# Patient Record
Sex: Female | Born: 2000 | Race: Black or African American | Hispanic: No | Marital: Single | Smoking: Never smoker
Health system: Southern US, Community
[De-identification: ages and names within clinical notes are randomized; demographics above are authoritative.]

## PROBLEM LIST (undated history)

## (undated) DIAGNOSIS — N83209 Unspecified ovarian cyst, unspecified side: Secondary | ICD-10-CM

## (undated) DIAGNOSIS — E0590 Thyrotoxicosis, unspecified without thyrotoxic crisis or storm: Secondary | ICD-10-CM

## (undated) HISTORY — DX: Thyrotoxicosis, unspecified without thyrotoxic crisis or storm: E05.90

## (undated) HISTORY — DX: Unspecified ovarian cyst, unspecified side: N83.209

## (undated) HISTORY — PX: NO PAST SURGERIES: SHX2092

---
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
## 2021-01-17 ENCOUNTER — Other Ambulatory Visit: Payer: Self-pay | Admitting: Family Medicine

## 2021-01-17 DIAGNOSIS — R109 Unspecified abdominal pain: Secondary | ICD-10-CM

## 2021-02-03 ENCOUNTER — Ambulatory Visit
Admission: RE | Admit: 2021-02-03 | Discharge: 2021-02-03 | Disposition: A | Discharge status: Home / Self Care | Payer: Medicaid Other | Source: Ambulatory Visit | Attending: Family Medicine | Admitting: Family Medicine

## 2021-02-03 DIAGNOSIS — R109 Unspecified abdominal pain: Secondary | ICD-10-CM

## 2021-07-12 ENCOUNTER — Encounter (HOSPITAL_COMMUNITY): Payer: Self-pay | Admitting: Emergency Medicine

## 2021-07-12 ENCOUNTER — Emergency Department (HOSPITAL_COMMUNITY): Payer: Medicaid Other

## 2021-07-12 ENCOUNTER — Emergency Department (HOSPITAL_COMMUNITY)
Admission: EM | Admit: 2021-07-12 | Discharge: 2021-07-12 | Disposition: A | Discharge status: Home / Self Care | Payer: Medicaid Other | Attending: Emergency Medicine | Admitting: Emergency Medicine

## 2021-07-12 DIAGNOSIS — J101 Influenza due to other identified influenza virus with other respiratory manifestations: Secondary | ICD-10-CM | POA: Insufficient documentation

## 2021-07-12 DIAGNOSIS — E876 Hypokalemia: Secondary | ICD-10-CM | POA: Diagnosis not present

## 2021-07-12 DIAGNOSIS — Z20822 Contact with and (suspected) exposure to covid-19: Secondary | ICD-10-CM | POA: Insufficient documentation

## 2021-07-12 DIAGNOSIS — R9389 Abnormal findings on diagnostic imaging of other specified body structures: Secondary | ICD-10-CM

## 2021-07-12 DIAGNOSIS — Z7722 Contact with and (suspected) exposure to environmental tobacco smoke (acute) (chronic): Secondary | ICD-10-CM | POA: Diagnosis not present

## 2021-07-12 DIAGNOSIS — N9489 Other specified conditions associated with female genital organs and menstrual cycle: Secondary | ICD-10-CM | POA: Insufficient documentation

## 2021-07-12 DIAGNOSIS — R000 Tachycardia, unspecified: Secondary | ICD-10-CM | POA: Insufficient documentation

## 2021-07-12 DIAGNOSIS — R918 Other nonspecific abnormal finding of lung field: Secondary | ICD-10-CM | POA: Insufficient documentation

## 2021-07-12 DIAGNOSIS — R509 Fever, unspecified: Secondary | ICD-10-CM | POA: Diagnosis present

## 2021-07-12 LAB — CBC WITH DIFFERENTIAL/PLATELET
Abs Immature Granulocytes: 0.01 10*3/uL (ref 0.00–0.07)
Basophils Absolute: 0 10*3/uL (ref 0.0–0.1)
Basophils Relative: 0 %
Eosinophils Absolute: 0.1 10*3/uL (ref 0.0–0.5)
Eosinophils Relative: 1 %
HCT: 38.3 % (ref 36.0–46.0)
Hemoglobin: 12.6 g/dL (ref 12.0–15.0)
Immature Granulocytes: 0 %
Lymphocytes Relative: 50 %
Lymphs Abs: 2.8 10*3/uL (ref 0.7–4.0)
MCH: 26 pg (ref 26.0–34.0)
MCHC: 32.9 g/dL (ref 30.0–36.0)
MCV: 79.1 fL — ABNORMAL LOW (ref 80.0–100.0)
Monocytes Absolute: 0.8 10*3/uL (ref 0.1–1.0)
Monocytes Relative: 14 %
Neutro Abs: 2 10*3/uL (ref 1.7–7.7)
Neutrophils Relative %: 35 %
Platelets: 254 10*3/uL (ref 150–400)
RBC: 4.84 MIL/uL (ref 3.87–5.11)
RDW: 12.1 % (ref 11.5–15.5)
WBC: 5.7 10*3/uL (ref 4.0–10.5)
nRBC: 0 % (ref 0.0–0.2)

## 2021-07-12 LAB — URINALYSIS, ROUTINE W REFLEX MICROSCOPIC
Bilirubin Urine: NEGATIVE
Glucose, UA: NEGATIVE mg/dL
Hgb urine dipstick: NEGATIVE
Ketones, ur: NEGATIVE mg/dL
Nitrite: NEGATIVE
Protein, ur: NEGATIVE mg/dL
Specific Gravity, Urine: 1.005 (ref 1.005–1.030)
pH: 6 (ref 5.0–8.0)

## 2021-07-12 LAB — BASIC METABOLIC PANEL
Anion gap: 9 (ref 5–15)
BUN: 7 mg/dL (ref 6–20)
CO2: 23 mmol/L (ref 22–32)
Calcium: 8.5 mg/dL — ABNORMAL LOW (ref 8.9–10.3)
Chloride: 103 mmol/L (ref 98–111)
Creatinine, Ser: 0.42 mg/dL — ABNORMAL LOW (ref 0.44–1.00)
GFR, Estimated: >60 mL/min (ref >60)
Glucose, Bld: 102 mg/dL — ABNORMAL HIGH (ref 70–99)
Potassium: 3.2 mmol/L — ABNORMAL LOW (ref 3.5–5.1)
Sodium: 135 mmol/L (ref 135–145)

## 2021-07-12 LAB — RESP PANEL BY RT-PCR (FLU A&B, COVID) ARPGX2
Influenza A by PCR: POSITIVE — AB
Influenza B by PCR: NEGATIVE
SARS Coronavirus 2 by RT PCR: NEGATIVE

## 2021-07-12 MED ORDER — ACETAMINOPHEN 500 MG PO TABS
1000.0000 mg | ORAL_TABLET | Freq: Once | ORAL | Status: AC
  Administered 2021-07-12: 1000 mg via ORAL
  Filled 2021-07-12: qty 2

## 2021-07-12 MED ORDER — BENZONATATE 100 MG PO CAPS: 100.0000 mg | ORAL_CAPSULE | Freq: Three times a day (TID) | ORAL | 0 refills | Status: DC

## 2021-07-12 MED ORDER — SODIUM CHLORIDE 0.9 % IV BOLUS
1000.0000 mL | Freq: Once | INTRAVENOUS | Status: AC
  Administered 2021-07-12: 1000 mL via INTRAVENOUS

## 2021-07-12 NOTE — Discharge Instructions (Addendum)
Please read and follow all provided instructions.  Your diagnoses today include:  1. Influenza A   2. Abnormal chest x-ray   3. Sinus tachycardia     Tests performed today include: Complete blood cell count: was normal Complete metabolic panel: slightly low potassium Urinalysis (urine test): some infection cells but not a clean urine sample Pregnancy test (urine or blood, in women only): negative Flu test: POSITIVE COVID test: NEGATIVE Chest x-ray: No pneumonia, but was abnormal and suggested possible swollen lymph nodes in the chest.  You should have this rechecked by your doctor in the next couple of weeks.  You may need a repeat x-ray or potentially a CT scan. Vital signs. See below for your results today.   Medications prescribed:  Tessalon Perles - cough suppressant medication  Take any prescribed medications only as directed.  Home care instructions:  Follow any educational materials contained in this packet. Please continue drinking plenty of fluids. Use over-the-counter cold and flu medications as needed as directed on packaging for symptom relief. You may also use ibuprofen or tylenol as directed on packaging for pain or fever.   BE VERY CAREFUL not to take multiple medicines containing Tylenol (also called acetaminophen). Doing so can lead to an overdose which can damage your liver and cause liver failure and possibly death.   Return instructions:  Please return to the Emergency Department if you experience worsening symptoms. Please return if you have a high fever greater than 101 degrees not controlled with over-the-counter medications, persistent vomiting and cannot keep down fluids, or worsening trouble breathing. Please return if you have any other emergent concerns.  Additional Information:  Your vital signs today were: BP (!) 156/98 (BP Location: Left Arm)   Pulse (!) 114   Temp 100.1 F (37.8 C)   Resp 20   Ht 5\' 4"  (1.626 m)   Wt 70.3 kg   LMP 06/11/2021    SpO2 100%   BMI 26.61 kg/m  If your blood pressure (BP) was elevated above 135/85 this visit, please have this repeated by your doctor within one month.

## 2021-07-12 NOTE — ED Triage Notes (Signed)
Per pt, states she is having flu like symptoms since Saturday-congestion, cough, fever, body aches

## 2021-07-12 NOTE — ED Notes (Signed)
Pt states understanding of dc instructions, follow up and OTC medications. Pt denies questions or concerns upon dc. Pt ambulated out of ed w/ steady gait.

## 2021-07-12 NOTE — ED Provider Notes (Signed)
[facility] DEPT Provider Note   CSN: [id number] Arrival date & time: 07/12/21  1012     History Chief Complaint  Patient presents with   flu like symptoms    Lisa Calderon is a 20 y.o. female.  Patient with no significant past medical history presents to the emergency department for evaluation of flulike illness.  Today is day 5 of symptoms.  She developed subjective fever, chills, body aches, cough, sore throat and congestion.  She denies associated nausea and vomiting.  She has had a couple episodes of diarrhea.  No urinary symptoms.  No improvement with over-the-counter medications.  She reports decreased oral fluid and solid intake.  Does have chest pain with coughing and some shortness of breath.      History reviewed. No pertinent past medical history.  There are no problems to display for this patient.   History reviewed. No pertinent surgical history.   OB History   No obstetric history on file.     No family history on file.  Social History   Tobacco Use   Smoking status: Passive Smoke Exposure - Never Smoker    Home Medications Prior to Admission medications   Medication Sig Start Date End Date Taking? Authorizing Provider  benzonatate (TESSALON) 100 MG capsule Take 1 capsule (100 mg total) by mouth every 8 (eight) hours. 10/14/15   Charmayne Sheer, NP  ibuprofen (ADVIL,MOTRIN) 200 MG tablet Take 400 mg by mouth every 6 (six) hours as needed.    [provider]  permethrin (ELIMITE) 5 % cream Apply to affected area once and wash off after 8-10 days.  Please repeat in 7-10 days qs 01/02/14   Isaac Bliss, MD    Allergies    Patient has no known allergies.  Review of Systems   Review of Systems  Constitutional:  Positive for appetite change, chills, fatigue and fever.  HENT:  Positive for congestion, rhinorrhea and sore throat.   Eyes:  Negative for redness.  Respiratory:  Positive for cough and shortness of  breath.   Cardiovascular:  Positive for chest pain.  Gastrointestinal:  Positive for diarrhea. Negative for abdominal pain, nausea and vomiting.  Genitourinary:  Negative for dysuria, frequency, hematuria and urgency.  Musculoskeletal:  Positive for myalgias.  Skin:  Negative for rash.  Neurological:  Negative for headaches.   Physical Exam Updated Vital Signs BP (!) 140/100 (BP Location: Left Arm)   Pulse (!) 141   Temp 99 F (37.2 C) (Oral)   Resp 20   Ht 5\' 4"  (1.626 m)   Wt 70.3 kg   LMP 06/11/2021   SpO2 100%   BMI 26.61 kg/m   Physical Exam Vitals and nursing note reviewed.  Constitutional:      General: She is not in acute distress.    Appearance: She is well-developed.  HENT:     Head: Normocephalic and atraumatic.     Right Ear: Tympanic membrane, ear canal and external ear normal.     Left Ear: Tympanic membrane, ear canal and external ear normal.     Nose: Congestion present.     Mouth/Throat:     Pharynx: Posterior oropharyngeal erythema present.  Eyes:     Conjunctiva/sclera: Conjunctivae normal.  Cardiovascular:     Rate and Rhythm: Regular rhythm. Tachycardia present.     Heart sounds: No murmur heard. Pulmonary:     Effort: No respiratory distress.     Breath sounds: No wheezing, rhonchi or rales.  Abdominal:  Palpations: Abdomen is soft.     Tenderness: There is no abdominal tenderness. There is no guarding or rebound.  Musculoskeletal:     Cervical back: Normal range of motion and neck supple.     Right lower leg: No edema.     Left lower leg: No edema.  Skin:    General: Skin is warm and dry.     Findings: No rash.  Neurological:     General: No focal deficit present.     Mental Status: She is alert. Mental status is at baseline.     Motor: No weakness.  Psychiatric:        Mood and Affect: Mood normal.    ED Results / Procedures / Treatments   Labs (all labs ordered are listed, but only abnormal results are displayed) Labs Reviewed   RESP PANEL BY RT-PCR (FLU A&B, COVID) ARPGX2 - Abnormal; Notable for the following components:      Result Value   Influenza A by PCR POSITIVE (*)    All other components within normal limits  CBC WITH DIFFERENTIAL/PLATELET - Abnormal; Notable for the following components:   MCV 79.1 (*)    All other components within normal limits  BASIC METABOLIC PANEL - Abnormal; Notable for the following components:   Potassium 3.2 (*)    Glucose, Bld 102 (*)    Creatinine, Ser 0.42 (*)    Calcium 8.5 (*)    All other components within normal limits  URINALYSIS, ROUTINE W REFLEX MICROSCOPIC - Abnormal; Notable for the following components:   APPearance CLOUDY (*)    Leukocytes,Ua LARGE (*)    Bacteria, UA RARE (*)    All other components within normal limits  I-STAT BETA HCG BLOOD, ED ([facility], [facility], [facility] ONLY)    EKG None  Radiology No results found.  Procedures Procedures   Medications Ordered in ED Medications  sodium chloride 0.9 % bolus 1,000 mL (has no administration in time range)    ED Course  I have reviewed the triage vital signs and the nursing notes.  Pertinent labs & imaging results that were available during my care of the patient were reviewed by me and considered in my medical decision making (see chart for details).  Patient seen and examined. Work-up initiated. Medications ordered.  Patient history and exam suggestive of flulike illness however her heart rate is between 125 and 140 in the room.  She does appear to be a bit dry on exam.  She would benefit from IV hydration and work-up including labs and chest x-ray.  Will reassess.  She is not hypoxic and does not have any other risk factors suggestive of pulmonary embolism.  Vital signs reviewed and are as follows: BP (!) 140/100 (BP Location: Left Arm)   Pulse (!) 141   Temp 99 F (37.2 C) (Oral)   Resp 20   Ht 5\' 4"  (1.626 m)   Wt 70.3 kg   LMP 06/11/2021   SpO2 100%   BMI 26.61 kg/m   1:19 PM patient is feeling  a bit better.  She still has some congestion in her chest.  She has received IV fluids which have made her feel better.  Heart rate improved into the low 110's.   We discussed abnormal x-ray findings and need to follow-up with PCP.  I do not think that she needs further work-up today given that her symptoms are consistent with her positive test for influenza.  Normal CBC.  We will continue supportive  treatment.  Patient discharged to home. Encouraged to rest and drink plenty of fluids.  Patient told to return to ED or see their primary doctor if their symptoms worsen, high fever not controlled with tylenol, persistent vomiting, they feel they are dehydrated, or if they have any other concerns.  Patient verbalized understanding and agreed with plan.       MDM Rules/Calculators/A&P                           Flulike illness: Negative COVID.  Positive influenza test.  Labs are reassuring.  Chest x-ray without pneumonia.  Sinus tachycardia: Was 130s to 140s on arrival, now 110s.  This was after IV fluids.  No concerning features of DVT/PE.  Patient has not been hypoxic.  She is not tachypneic.  Do not think that she requires further work-up with D-dimer or CT of the chest today.  Abnormal chest x-ray: Shows possibility of lymphadenopathy.  No pneumonia.  Patient will have this followed up as an outpatient.  Discussed with patient at bedside.  Low potassium: Minimally low at 3.2.  Do not think that this has anything to do with her tachycardia.  Patient is tolerating solids and can continue p.o. intake.  Abnormal UA: Not a clean-catch, no urinary symptoms.  Do not suspect UTI or pyelonephritis.    Final Clinical Impression(s) / ED Diagnoses Final diagnoses:  Influenza A  Abnormal chest x-ray  Sinus tachycardia    Rx / DC Orders ED Discharge Orders     None        Carlisle Cater, PA-C 07/12/21 1323    Daleen Bo, MD 07/12/21 1418

## 2021-07-12 NOTE — ED Notes (Signed)
Pt tolerating fluids well. Pt denies nausea.  Pt given water and OJ

## 2021-08-02 LAB — OB RESULTS CONSOLE GC/CHLAMYDIA
Chlamydia: NEGATIVE
Neisseria Gonorrhea: NEGATIVE

## 2021-08-09 LAB — OB RESULTS CONSOLE HIV ANTIBODY (ROUTINE TESTING): HIV: NONREACTIVE

## 2021-08-09 LAB — OB RESULTS CONSOLE ANTIBODY SCREEN: Antibody Screen: NEGATIVE

## 2021-08-09 LAB — HEPATITIS C ANTIBODY: HCV Ab: NEGATIVE

## 2021-08-09 LAB — OB RESULTS CONSOLE ABO/RH: RH Type: POSITIVE

## 2021-08-09 LAB — OB RESULTS CONSOLE RPR: RPR: NONREACTIVE

## 2021-08-09 LAB — OB RESULTS CONSOLE RUBELLA ANTIBODY, IGM: Rubella: IMMUNE

## 2021-08-09 LAB — OB RESULTS CONSOLE HEPATITIS B SURFACE ANTIGEN: Hepatitis B Surface Ag: NEGATIVE

## 2021-08-10 NOTE — Progress Notes (Deleted)
Name: Lisa Calderon  MRN/ DOB: [id number], 07-02-2001    Age/ Sex: 20 y.o., female    PCP: Deatra James, MD   Reason for Endocrinology Evaluation: Hyperthyroidism     Date of Initial Endocrinology Evaluation: 08/11/2021     HPI: [name] is a 20 y.o. female with unremarkable PMH. The patient presented for initial endocrinology clinic visit on 08/11/2021 for consultative assistance with her Hyperthyroidism .     She was diagnosed with hyperthyroidism in 07/2021 during routine physical when she was noted to have a suppressed TSH 0.01 uIU/mL and elevated FT4 4.42 ng/dL.     LMP 07/02/2021  HISTORY:  Past Medical History: No past medical history on file. Past Surgical History: No past surgical history on file.  Social History:  reports that she is a non-smoker but has been exposed to tobacco smoke. She does not have any smokeless tobacco history on file. Family History: family history is not on file.   HOME MEDICATIONS: Allergies as of 08/11/2021   No Known Allergies      Medication List        Accurate as of August 11, 2021 11:12 AM. If you have any questions, ask your nurse or doctor.          benzonatate 100 MG capsule Commonly known as: TESSALON Take 1 capsule (100 mg total) by mouth every 8 (eight) hours.   ibuprofen 200 MG tablet Commonly known as: ADVIL Take 400 mg by mouth every 6 (six) hours as needed for fever, headache or mild pain.   Vicks DayQuil/NyQuil Cough 6.25-15 & 15 MG/15ML Lqpk Generic drug: Doxylamine-DM & DM Take 15 mLs by mouth 2 (two) times daily as needed (congestion/sleep).          REVIEW OF SYSTEMS: A comprehensive ROS was conducted with the patient and is negative except as per HPI and below:  ROS     OBJECTIVE:  VS: There were no vitals taken for this visit.   Wt Readings from Last 3 Encounters:  07/12/21 155 lb (70.3 kg)  10/14/15 177 lb 12.8 oz (80.6 kg) (96 %, Z= 1.75)*   * Growth percentiles are based  on CDC (Girls, 2-20 Years) data.     EXAM: General: Pt appears well and is in NAD  Hydration: Well-hydrated with moist mucous membranes and good skin turgor  Eyes: External eye exam normal without stare, lid lag or exophthalmos.  EOM intact.  PERRL.  Ears, Nose, Throat: Hearing: Grossly intact bilaterally Dental: Good dentition  Throat: Clear without mass, erythema or exudate  Neck: General: Supple without adenopathy. Thyroid: Thyroid size normal.  No goiter or nodules appreciated. No thyroid bruit.  Lungs: Clear with good BS bilat with no rales, rhonchi, or wheezes  Heart: Auscultation: RRR.  Abdomen: Normoactive bowel sounds, soft, nontender, without masses or organomegaly palpable  Extremities: Gait and station: Normal gait  Digits and nails: No clubbing, cyanosis, petechiae, or nodes Head and neck: Normal alignment and mobility BL UE: Normal ROM and strength. BL LE: No pretibial edema normal ROM and strength.  Skin: Hair: Texture and amount normal with gender appropriate distribution Skin Inspection: No rashes, acanthosis nigricans/skin tags. No lipohypertrophy Skin Palpation: Skin temperature, texture, and thickness normal to palpation  Neuro: Cranial nerves: II - XII grossly intact  Cerebellar: Normal coordination and movement; no tremor Motor: Normal strength throughout DTRs: 2+ and symmetric in UE without delay in relaxation phase  Mental Status: Judgment, insight: Intact Orientation: Oriented to time,  place, and person Memory: Intact for recent and remote events Mood and affect: No depression, anxiety, or agitation     DATA REVIEWED: ***    ASSESSMENT/PLAN/RECOMMENDATIONS:   ***    Medications :  Signed electronically by: Lyndle Herrlich, MD  [facility] Endocrinology  [facility] [address] [place], [place] [address] Phone: [phone] FAX: [phone]   CC: Deatra James, MD [address] [place]  [place] [address] Phone: [phone] Fax: [phone]   Return to Endocrinology clinic as below: Future Appointments  Date Time Provider Department Center  08/11/2021 11:30 AM Stefanee Mckell, Konrad Dolores, MD [facility] None

## 2021-08-11 ENCOUNTER — Ambulatory Visit: Payer: Medicaid Other | Admitting: Internal Medicine

## 2021-08-17 ENCOUNTER — Ambulatory Visit: Payer: Medicaid Other | Admitting: Internal Medicine

## 2021-08-17 ENCOUNTER — Encounter: Payer: Self-pay | Admitting: Internal Medicine

## 2021-08-17 ENCOUNTER — Other Ambulatory Visit: Payer: Self-pay

## 2021-08-17 VITALS — BP 144/80 | HR 102 | Ht 64.0 in | Wt 154.8 lb

## 2021-08-17 DIAGNOSIS — E0590 Thyrotoxicosis, unspecified without thyrotoxic crisis or storm: Secondary | ICD-10-CM | POA: Diagnosis not present

## 2021-08-17 DIAGNOSIS — O99280 Endocrine, nutritional and metabolic diseases complicating pregnancy, unspecified trimester: Secondary | ICD-10-CM

## 2021-08-17 LAB — COMPREHENSIVE METABOLIC PANEL
ALT: 20 U/L (ref 0–35)
AST: 15 U/L (ref 0–37)
Albumin: 4 g/dL (ref 3.5–5.2)
Alkaline Phosphatase: 116 U/L (ref 39–117)
BUN: 7 mg/dL (ref 6–23)
CO2: 24 mEq/L (ref 19–32)
Calcium: 9.3 mg/dL (ref 8.4–10.5)
Chloride: 101 mEq/L (ref 96–112)
Creatinine, Ser: 0.31 mg/dL — ABNORMAL LOW (ref 0.40–1.20)
GFR: 150.03 mL/min (ref >60.00)
Glucose, Bld: 96 mg/dL (ref 70–99)
Potassium: 4.2 mEq/L (ref 3.5–5.1)
Sodium: 132 mEq/L — ABNORMAL LOW (ref 135–145)
Total Bilirubin: 0.3 mg/dL (ref 0.2–1.2)
Total Protein: 7 g/dL (ref 6.0–8.3)

## 2021-08-17 LAB — CBC WITH DIFFERENTIAL/PLATELET
Basophils Absolute: 0 10*3/uL (ref 0.0–0.1)
Basophils Relative: 0.3 % (ref 0.0–3.0)
Eosinophils Absolute: 0 10*3/uL (ref 0.0–0.7)
Eosinophils Relative: 0.6 % (ref 0.0–5.0)
HCT: 35.7 % — ABNORMAL LOW (ref 36.0–46.0)
Hemoglobin: 11.8 g/dL — ABNORMAL LOW (ref 12.0–15.0)
Lymphocytes Relative: 43 % (ref 12.0–46.0)
Lymphs Abs: 3.2 10*3/uL (ref 0.7–4.0)
MCHC: 33.2 g/dL (ref 30.0–36.0)
MCV: 79.5 fl (ref 78.0–100.0)
Monocytes Absolute: 0.8 10*3/uL (ref 0.1–1.0)
Monocytes Relative: 10.9 % (ref 3.0–12.0)
Neutro Abs: 3.3 10*3/uL (ref 1.4–7.7)
Neutrophils Relative %: 45.2 % (ref 43.0–77.0)
Platelets: 374 10*3/uL (ref 150.0–400.0)
RBC: 4.49 Mil/uL (ref 3.87–5.11)
RDW: 13.4 % (ref 11.5–15.5)
WBC: 7.4 10*3/uL (ref 4.0–10.5)

## 2021-08-17 LAB — TSH: TSH: <0.01 u[IU]/mL — ABNORMAL LOW (ref 0.35–5.50)

## 2021-08-17 LAB — T3, FREE: T3, Free: 13.5 pg/mL — ABNORMAL HIGH (ref 2.3–4.2)

## 2021-08-17 LAB — T4, FREE: Free T4: 3.45 ng/dL — ABNORMAL HIGH (ref 0.60–1.60)

## 2021-08-17 NOTE — Progress Notes (Signed)
Name: Lisa Calderon  MRN/ DOB: [id number], 2001/04/19    Age/ Sex: 20 y.o., female    PCP: Deatra James, MD   Reason for Endocrinology Evaluation: Hyperthyroidism     Date of Initial Endocrinology Evaluation: 08/17/2021     HPI: [name] is a 20 y.o. female with unremarkable PMH. The patient presented for initial endocrinology clinic visit on 08/17/2021 for consultative assistance with her Hyperthyroidism .     She was diagnosed with hyperthyroidism in 07/2021 during routine physical when she was noted to have a suppressed TSH 0.01 uIU/mL and elevated FT4 4.42 ng/dL. She also tested positive for influenza at the time as well.     LMP 07/02/2021   She had a positive pregnancy test and has a pending appointment with  [name] Scheduled 12/27th    Denies weight loss but has been bloated  Denies nausea or vomiting  Denies diarrhea  Denies tremors  Has rare random  palpitations  Denies local neck swelling or pain   Denies FH of thyroid disease      HISTORY:  Past Medical History: History reviewed. No pertinent past medical history. Past Surgical History: History reviewed. No pertinent surgical history.  Social History:  reports that she has never smoked. She has been exposed to tobacco smoke. She does not have any smokeless tobacco history on file. Family History: family history is not on file.   HOME MEDICATIONS: Allergies as of 08/17/2021   No Known Allergies      Medication List        Accurate as of August 17, 2021  1:48 PM. If you have any questions, ask your nurse or doctor.          STOP taking these medications    benzonatate 100 MG capsule Commonly known as: TESSALON Stopped by: Scarlette Shorts, MD   ibuprofen 200 MG tablet Commonly known as: ADVIL Stopped by: Scarlette Shorts, MD   Vicks DayQuil/NyQuil Cough 6.25-15 & 15 MG/15ML Lqpk Generic drug: Doxylamine-DM & DM Stopped by: Scarlette Shorts, MD       TAKE  these medications    Prenatal Vitamin 27-0.8 MG Tabs Take 1 tablet by mouth.          REVIEW OF SYSTEMS: A comprehensive ROS was conducted with the patient and is negative except as per HPI     OBJECTIVE:  VS: BP (!) 144/80 (BP Location: Left Arm, Patient Position: Sitting, Cuff Size: Small)    Pulse (!) 102    Ht 5\' 4"  (1.626 m)    Wt 154 lb 12.8 oz (70.2 kg)    SpO2 98%    BMI 26.57 kg/m    Wt Readings from Last 3 Encounters:  08/17/21 154 lb 12.8 oz (70.2 kg)  07/12/21 155 lb (70.3 kg)  10/14/15 177 lb 12.8 oz (80.6 kg) (96 %, Z= 1.75)*   * Growth percentiles are based on CDC (Girls, 2-20 Years) data.     EXAM: General: Pt appears well and is in NAD  Eyes: External eye exam normal without stare, lid lag or exophthalmos.  EOM intact.  PERRL.  Neck: General: Supple without adenopathy. Thyroid: Thyroid size enlaraged ~ 60 grams.+ thyroid bruit.  Lungs: Clear with good BS bilat with no rales, rhonchi, or wheezes  Heart: Auscultation: RRR.  Abdomen: Normoactive bowel sounds, soft, nontender, without masses or organomegaly palpable  Extremities:  BL LE: No pretibial edema normal ROM and strength.  Mental Status: Judgment, insight:  Intact Orientation: Oriented to time, place, and person Mood and affect: No depression, anxiety, or agitation     DATA REVIEWED:  Latest Reference Range & Units 08/17/21 13:59  Sodium 135 - 145 mEq/L 132 (L)  Potassium 3.5 - 5.1 mEq/L 4.2  Chloride 96 - 112 mEq/L 101  CO2 19 - 32 mEq/L 24  Glucose 70 - 99 mg/dL 96  BUN 6 - 23 mg/dL 7  Creatinine 7.03 - 5.00 mg/dL 9.38 (L)  Calcium 8.4 - 10.5 mg/dL 9.3  Alkaline Phosphatase 39 - 117 U/L 116  Albumin 3.5 - 5.2 g/dL 4.0  AST 0 - 37 U/L 15  ALT 0 - 35 U/L 20  Total Protein 6.0 - 8.3 g/dL 7.0  Total Bilirubin 0.2 - 1.2 mg/dL 0.3  GFR >18.29 mL/min 150.03  WBC 4.0 - 10.5 K/uL 7.4  RBC 3.87 - 5.11 Mil/uL 4.49  Hemoglobin 12.0 - 15.0 g/dL 93.7 (L)  HCT 16.9 - 67.8 % 35.7 (L)  MCV 78.0 -  100.0 fl 79.5  MCHC 30.0 - 36.0 g/dL 93.8  RDW 10.1 - 75.1 % 13.4  Platelets 150.0 - 400.0 K/uL 374.0      Latest Reference Range & Units 08/17/21 13:59  Neutrophils 43.0 - 77.0 % 45.2  Lymphocytes 12.0 - 46.0 % 43.0  Monocytes Relative 3.0 - 12.0 % 10.9  Eosinophil 0.0 - 5.0 % 0.6  Basophil 0.0 - 3.0 % 0.3  NEUT# 1.4 - 7.7 K/uL 3.3  Lymphocyte # 0.7 - 4.0 K/uL 3.2  Monocyte # 0.1 - 1.0 K/uL 0.8  Eosinophils Absolute 0.0 - 0.7 K/uL 0.0  Basophils Absolute 0.0 - 0.1 K/uL 0.0  Glucose 70 - 99 mg/dL 96  Preg, Serum  POSITIVE !  TSH 0.35 - 5.50 uIU/mL <0.01 Repeated and verified X2. (L)  Triiodothyronine,Free,Serum 2.3 - 4.2 pg/mL 13.5 (H)  T4,Free(Direct) 0.60 - 1.60 ng/dL 0.25 (H)    ASSESSMENT/PLAN/RECOMMENDATIONS:   Hyperthyroidism :  - Pt is clinically euthyroid  - We discussed that Graves' Disease is a result of an autoimmune condition involving the thyroid.  - She had a positive pregnancy test at home, pending appointment with [name] next week  - We discussed D/D includes Graves' Disease vs subacute thyroiditis given that she was diagnosed with the Influenza and hyperthyroid at the same time   - We discussed with pt the benefits of methimazole/ PTU  in the Tx of hyperthyroidism, as well as the possible side effects/complications of anti-thyroid drug Tx (specifically detailing the rare, but serious side effect of agranulocytosis). She was informed of need for regular thyroid function monitoring while on methimazole to ensure appropriate dosage without over-treatment. As well, we discussed the possible side effects of methimazole/PTU  including the chance of rash, the small chance of liver irritation/juandice and the <=1 in 300-400 chance of sudden onset agranulocytosis.  We discussed importance of going to ED promptly (and stopping methimazole) if shewere to develop significant fever with severe sore throat of other evidence of acute infection.     - We extensively discussed  the various treatment options for hyperthyroidism and Graves disease including ablation therapy with radioactive iodine versus antithyroid drug treatment versus surgical therapy.  We recommended to the patient that we felt, at this time, that thionamide therapy would be most optimal.  We discussed the various possible benefits versus side effects of the various therapies.   - I carefully explained to the patient that one of the consequences of I-131 ablation treatment would likely  be permanent hypothyroidism which would require long-term replacement therapy with LT4.  - We explained the importance of serial labs during pregnancy  - The goal of treatment is to maintain persistent but mild hyperthyroidism in the mother in an attempt to prevent fetal hypothyroidism since the fetal thyroid is more sensitive to the action of thionamide therapy. Overtreatment of maternal hyperthyroidism can cause fetal goiter and primary hypothyroidism.       Medications : Start PTU 50 mg BID    F/U in 8 weeks  Labs in 4 weeks    Signed electronically by: Lyndle Herrlich, MD  [facility] Endocrinology  [facility] [address] [place] [place], [place] [address] Phone: [phone] FAX: [phone]   CC: Deatra James, MD [address] [place] [address] Phone: [phone] Fax: [phone]   Return to Endocrinology clinic as below: No future appointments.

## 2021-08-18 ENCOUNTER — Telehealth: Payer: Self-pay | Admitting: Internal Medicine

## 2021-08-18 DIAGNOSIS — O99280 Endocrine, nutritional and metabolic diseases complicating pregnancy, unspecified trimester: Secondary | ICD-10-CM | POA: Insufficient documentation

## 2021-08-18 DIAGNOSIS — E0590 Thyrotoxicosis, unspecified without thyrotoxic crisis or storm: Secondary | ICD-10-CM | POA: Insufficient documentation

## 2021-08-18 MED ORDER — PROPYLTHIOURACIL 50 MG PO TABS: 50.0000 mg | ORAL_TABLET | Freq: Two times a day (BID) | ORAL | 1 refills | Status: DC

## 2021-08-18 NOTE — Telephone Encounter (Signed)
Please let the pt know that her labs continue to show overactive thyroid and pregnancy has been confirmed      Please start PTU 50 mg TWICE a day    Remind her of her labs in 4 weeks     Thanks

## 2021-08-18 NOTE — Telephone Encounter (Signed)
Patient has been notified and verbalized understanding 

## 2021-08-18 NOTE — Telephone Encounter (Signed)
Vm left a for patient to callback for result

## 2021-08-21 LAB — HCG, SERUM, QUALITATIVE: Preg, Serum: POSITIVE — AB

## 2021-08-21 LAB — TRAB (TSH RECEPTOR BINDING ANTIBODY): TRAB: 18.78 IU/L — ABNORMAL HIGH (ref <=2.00)

## 2021-09-15 ENCOUNTER — Other Ambulatory Visit: Payer: Medicaid Other

## 2021-10-03 ENCOUNTER — Other Ambulatory Visit: Payer: Self-pay | Admitting: Obstetrics and Gynecology

## 2021-10-03 ENCOUNTER — Other Ambulatory Visit (HOSPITAL_COMMUNITY)
Admission: RE | Admit: 2021-10-03 | Discharge: 2021-10-03 | Disposition: A | Discharge status: Home / Self Care | Payer: Medicaid Other | Source: Ambulatory Visit | Attending: Obstetrics and Gynecology | Admitting: Obstetrics and Gynecology

## 2021-10-03 DIAGNOSIS — Z3490 Encounter for supervision of normal pregnancy, unspecified, unspecified trimester: Secondary | ICD-10-CM | POA: Diagnosis not present

## 2021-10-04 LAB — CYTOLOGY - PAP: Diagnosis: NEGATIVE

## 2021-10-13 ENCOUNTER — Ambulatory Visit: Payer: Medicaid Other | Admitting: Internal Medicine

## 2021-10-13 NOTE — Progress Notes (Unsigned)
Name: Lisa Calderon  MRN/ DOB: [id number], 2001-04-23    Age/ Sex: 21 y.o., female    PCP: Deatra James, MD   Reason for Endocrinology Evaluation: Hyperthyroidism     Date of Initial Endocrinology Evaluation: 08/17/2021    HPI: [name] is a 21 y.o. female with unremarkable PMH. The patient presented for initial endocrinology clinic visit on 08/17/2021 for consultative assistance with her Hyperthyroidism .     She was diagnosed with hyperthyroidism in 07/2021 during routine physical when she was noted to have a suppressed TSH 0.01 uIU/mL and elevated FT4 4.42 ng/dL. She also tested positive for influenza at the time as well.     LMP 07/02/2021   She had a positive pregnancy test and saw  [name] ( Dr. Gerald Leitz ) 10/03/2021  Was started on PTU 07/2021 with a suppressed TSH < 0.01 uIU/mL and elevated FT4 at 3.45 ng/dL (8.0-8.8) and elevated FT3 13.5 pg/mL  ( 2.3-4.2)    Denies FH of thyroid disease      SUBJECTIVE:    Today (10/13/21):  [name] is here for a follow up on Hyperthyroidism.    Denies weight loss but has been bloated  Denies nausea or vomiting  Denies diarrhea  Denies tremors  Has rare random  palpitations  Denies local neck swelling or pain    PTU 50 mg BID   HISTORY:  Past Medical History: No past medical history on file. Past Surgical History: No past surgical history on file.  Social History:  reports that she has never smoked. She has been exposed to tobacco smoke. She does not have any smokeless tobacco history on file. Family History: family history is not on file.   HOME MEDICATIONS: Allergies as of 10/13/2021   No Known Allergies      Medication List        Accurate as of October 13, 2021  7:35 AM. If you have any questions, ask your nurse or doctor.          Prenatal Vitamin 27-0.8 MG Tabs Take 1 tablet by mouth.   propylthiouracil 50 MG tablet Commonly known as: PTU Take 1 tablet (50 mg total) by mouth 2  (two) times daily.          REVIEW OF SYSTEMS: A comprehensive ROS was conducted with the patient and is negative except as per HPI     OBJECTIVE:  VS: There were no vitals taken for this visit.   Wt Readings from Last 3 Encounters:  08/17/21 154 lb 12.8 oz (70.2 kg)  07/12/21 155 lb (70.3 kg)  10/14/15 177 lb 12.8 oz (80.6 kg) (96 %, Z= 1.75)*   * Growth percentiles are based on CDC (Girls, 2-20 Years) data.     EXAM: General: Pt appears well and is in NAD  Eyes: External eye exam normal without stare, lid lag or exophthalmos.  EOM intact.  PERRL.  Neck: General: Supple without adenopathy. Thyroid: Thyroid size enlaraged ~ 60 grams.+ thyroid bruit.  Lungs: Clear with good BS bilat with no rales, rhonchi, or wheezes  Heart: Auscultation: RRR.  Abdomen: Normoactive bowel sounds, soft, nontender, without masses or organomegaly palpable  Extremities:  BL LE: No pretibial edema normal ROM and strength.  Mental Status: Judgment, insight: Intact Orientation: Oriented to time, place, and person Mood and affect: No depression, anxiety, or agitation     DATA REVIEWED:  Latest Reference Range & Units 08/17/21 13:59  Sodium 135 - 145 mEq/L  132 (L)  Potassium 3.5 - 5.1 mEq/L 4.2  Chloride 96 - 112 mEq/L 101  CO2 19 - 32 mEq/L 24  Glucose 70 - 99 mg/dL 96  BUN 6 - 23 mg/dL 7  Creatinine 1.30 - 8.65 mg/dL 7.84 (L)  Calcium 8.4 - 10.5 mg/dL 9.3  Alkaline Phosphatase 39 - 117 U/L 116  Albumin 3.5 - 5.2 g/dL 4.0  AST 0 - 37 U/L 15  ALT 0 - 35 U/L 20  Total Protein 6.0 - 8.3 g/dL 7.0  Total Bilirubin 0.2 - 1.2 mg/dL 0.3  GFR >69.62 mL/min 150.03  WBC 4.0 - 10.5 K/uL 7.4  RBC 3.87 - 5.11 Mil/uL 4.49  Hemoglobin 12.0 - 15.0 g/dL 95.2 (L)  HCT 84.1 - 32.4 % 35.7 (L)  MCV 78.0 - 100.0 fl 79.5  MCHC 30.0 - 36.0 g/dL 40.1  RDW 02.7 - 25.3 % 13.4  Platelets 150.0 - 400.0 K/uL 374.0      Latest Reference Range & Units 08/17/21 13:59  Neutrophils 43.0 - 77.0 % 45.2   Lymphocytes 12.0 - 46.0 % 43.0  Monocytes Relative 3.0 - 12.0 % 10.9  Eosinophil 0.0 - 5.0 % 0.6  Basophil 0.0 - 3.0 % 0.3  NEUT# 1.4 - 7.7 K/uL 3.3  Lymphocyte # 0.7 - 4.0 K/uL 3.2  Monocyte # 0.1 - 1.0 K/uL 0.8  Eosinophils Absolute 0.0 - 0.7 K/uL 0.0  Basophils Absolute 0.0 - 0.1 K/uL 0.0  Glucose 70 - 99 mg/dL 96  Preg, Serum  POSITIVE !  TSH 0.35 - 5.50 uIU/mL <0.01 Repeated and verified X2. (L)  Triiodothyronine,Free,Serum 2.3 - 4.2 pg/mL 13.5 (H)  T4,Free(Direct) 0.60 - 1.60 ng/dL 6.64 (H)    ASSESSMENT/PLAN/RECOMMENDATIONS:   Hyperthyroidism :  - Pt is clinically euthyroid  - We discussed that Graves' Disease is a result of an autoimmune condition involving the thyroid.  - She had a positive pregnancy test at home, pending appointment with [name] next week  - We discussed D/D includes Graves' Disease vs subacute thyroiditis given that she was diagnosed with the Influenza and hyperthyroid at the same time   - We discussed with pt the benefits of methimazole/ PTU  in the Tx of hyperthyroidism, as well as the possible side effects/complications of anti-thyroid drug Tx (specifically detailing the rare, but serious side effect of agranulocytosis). She was informed of need for regular thyroid function monitoring while on methimazole to ensure appropriate dosage without over-treatment. As well, we discussed the possible side effects of methimazole/PTU  including the chance of rash, the small chance of liver irritation/juandice and the <=1 in 300-400 chance of sudden onset agranulocytosis.  We discussed importance of going to ED promptly (and stopping methimazole) if shewere to develop significant fever with severe sore throat of other evidence of acute infection.     - We extensively discussed the various treatment options for hyperthyroidism and Graves disease including ablation therapy with radioactive iodine versus antithyroid drug treatment versus surgical therapy.  We  recommended to the patient that we felt, at this time, that thionamide therapy would be most optimal.  We discussed the various possible benefits versus side effects of the various therapies.   - I carefully explained to the patient that one of the consequences of I-131 ablation treatment would likely be permanent hypothyroidism which would require long-term replacement therapy with LT4.  - We explained the importance of serial labs during pregnancy  - The goal of treatment is to maintain persistent but mild hyperthyroidism in  the mother in an attempt to prevent fetal hypothyroidism since the fetal thyroid is more sensitive to the action of thionamide therapy. Overtreatment of maternal hyperthyroidism can cause fetal goiter and primary hypothyroidism.       Medications : Start PTU 50 mg BID    F/U in 8 weeks  Labs in 4 weeks    Signed electronically by: Lyndle Herrlich, MD  [facility] Endocrinology  [facility] [address] [place] [place], [place] [address] Phone: [phone] FAX: [phone]   CC: Deatra James, MD [address] [place] [address] Phone: [phone] Fax: [phone]   Return to Endocrinology clinic as below: Future Appointments  Date Time Provider Department Center  10/13/2021 10:10 AM Nancye Grumbine, Konrad Dolores, MD [facility] None

## 2022-01-19 ENCOUNTER — Ambulatory Visit (HOSPITAL_BASED_OUTPATIENT_CLINIC_OR_DEPARTMENT_OTHER): Payer: Medicaid Other | Admitting: *Deleted

## 2022-01-19 ENCOUNTER — Ambulatory Visit: Payer: Medicaid Other | Attending: Obstetrics and Gynecology

## 2022-01-19 ENCOUNTER — Ambulatory Visit (HOSPITAL_BASED_OUTPATIENT_CLINIC_OR_DEPARTMENT_OTHER): Payer: Medicaid Other | Admitting: Obstetrics and Gynecology

## 2022-01-19 ENCOUNTER — Other Ambulatory Visit: Payer: Self-pay | Admitting: Obstetrics and Gynecology

## 2022-01-19 ENCOUNTER — Ambulatory Visit: Payer: Medicaid Other | Admitting: *Deleted

## 2022-01-19 VITALS — BP 132/74 | HR 97

## 2022-01-19 DIAGNOSIS — Z3A28 28 weeks gestation of pregnancy: Secondary | ICD-10-CM | POA: Diagnosis present

## 2022-01-19 DIAGNOSIS — O365930 Maternal care for other known or suspected poor fetal growth, third trimester, not applicable or unspecified: Secondary | ICD-10-CM | POA: Diagnosis present

## 2022-01-19 DIAGNOSIS — Z363 Encounter for antenatal screening for malformations: Secondary | ICD-10-CM

## 2022-01-19 DIAGNOSIS — O365990 Maternal care for other known or suspected poor fetal growth, unspecified trimester, not applicable or unspecified: Secondary | ICD-10-CM | POA: Diagnosis present

## 2022-01-19 NOTE — Progress Notes (Signed)
Maternal-Fetal Medicine   Name: Lisa Calderon DOB: January 31, 2001 MRN: [id number] Referring Provider: Gerald Leitz, MD  I had the pleasure of seeing [name] [facility] at [facility].  She is here for a second opinion.  At your office ultrasound, fetal growth restriction was detected. Her pregnancy is dated by LMP date consistent with first trimester ultrasound performed at your office. Patient reports no chronic medical conditions.  She probably had gestational thyrotoxicosis in the first trimester. According to the patient, cell-free fetal DNA screening showed low risk for fetal aneuploidies. Blood pressure today at her office is 132/74 mmHg, pulse 97/minute.  Ultrasound On today's ultrasound, the estimated fetal weight is at the 1st percentile and the abdominal circumference measurement is at the 2nd percentile.  Head circumference measurement is between -2 and -1 SD (normal).  Amniotic fluid is normal and good fetal activity seen.   Fetal anatomical survey appears normal but limited by advanced gestational age.  Umbilical artery Doppler showed normal forward diastolic flow.  NST is reactive.  Fetal growth restriction I explained the finding of fetal growth restriction that is difficult to differentiate from a constitutionally small for gestational age fetus. I discussed the possible causes of fetal growth restriction including placental insufficiency (most common cause), chromosomal anomalies and fetal infections.  Patient did not give history of fever or rashes. I explained that only amniocentesis will give a definitive result on the fetal karyotype and some genetic conditions (Microarray).  I explained amniocentesis procedure and possible complication of preterm delivery (1 and 500 procedures). I discussed ultrasound protocol of monitoring fetal growth restriction. Timing of delivery: Since small for gestational age fetuses have a higher chance of having perinatal mortality  and morbidity, delivery at 57 to [redacted] weeks gestation is reasonable.  If, however, severe fetal growth restriction is seen with normal antenatal testing, we will recommend delivery at [redacted] weeks gestation.  Recommendations -UA Doppler and NST next week and continue weekly antenatal testing. -We will discuss timing of delivery after fetal growth assessments. -Please fax [place] first-trimester ultrasound report if available.   Thank you for consultation.  If you have any questions or concerns, please contact me the [facility].  Consultation including face-to-face (more than 50%) counseling 30 minutes.

## 2022-01-19 NOTE — Procedures (Signed)
Lisa Calderon Dec 11, 2000 [redacted]w[redacted]d  Fetus A Non-Stress Test Interpretation for 01/19/22  Indication: IUGR  Fetal Heart Rate A Mode: External Baseline Rate (A): 145 bpm Variability: Moderate Accelerations: 10 x 10 Decelerations: None Multiple birth?: No  Uterine Activity Mode: Toco Contraction Frequency (min): none Resting Tone Palpated: Relaxed  Interpretation (Fetal Testing) Nonstress Test Interpretation: Reactive Overall Impression: Reassuring for gestational age Comments: tracing reviewed by Dr. Judeth Cornfield

## 2022-01-24 ENCOUNTER — Other Ambulatory Visit: Payer: Self-pay | Admitting: *Deleted

## 2022-01-24 DIAGNOSIS — O365990 Maternal care for other known or suspected poor fetal growth, unspecified trimester, not applicable or unspecified: Secondary | ICD-10-CM

## 2022-01-25 ENCOUNTER — Ambulatory Visit: Payer: Medicaid Other | Admitting: *Deleted

## 2022-01-25 ENCOUNTER — Ambulatory Visit: Payer: Medicaid Other | Attending: Obstetrics and Gynecology

## 2022-01-25 ENCOUNTER — Ambulatory Visit (HOSPITAL_BASED_OUTPATIENT_CLINIC_OR_DEPARTMENT_OTHER): Payer: Medicaid Other | Admitting: *Deleted

## 2022-01-25 VITALS — BP 151/83 | HR 104

## 2022-01-25 DIAGNOSIS — O358XX0 Maternal care for other (suspected) fetal abnormality and damage, not applicable or unspecified: Secondary | ICD-10-CM | POA: Diagnosis not present

## 2022-01-25 DIAGNOSIS — Z3A29 29 weeks gestation of pregnancy: Secondary | ICD-10-CM

## 2022-01-25 DIAGNOSIS — O365930 Maternal care for other known or suspected poor fetal growth, third trimester, not applicable or unspecified: Secondary | ICD-10-CM | POA: Diagnosis not present

## 2022-01-25 DIAGNOSIS — O99283 Endocrine, nutritional and metabolic diseases complicating pregnancy, third trimester: Secondary | ICD-10-CM | POA: Diagnosis present

## 2022-01-25 DIAGNOSIS — O365990 Maternal care for other known or suspected poor fetal growth, unspecified trimester, not applicable or unspecified: Secondary | ICD-10-CM | POA: Insufficient documentation

## 2022-01-25 DIAGNOSIS — O365931 Maternal care for other known or suspected poor fetal growth, third trimester, fetus 1: Secondary | ICD-10-CM | POA: Insufficient documentation

## 2022-01-25 DIAGNOSIS — E0590 Thyrotoxicosis, unspecified without thyrotoxic crisis or storm: Secondary | ICD-10-CM

## 2022-01-25 DIAGNOSIS — Z8489 Family history of other specified conditions: Secondary | ICD-10-CM

## 2022-01-25 NOTE — Procedures (Signed)
Lisa Calderon May 03, 2001 [redacted]w[redacted]d  Fetus A Non-Stress Test Interpretation for 01/25/22  Indication: IUGR  Fetal Heart Rate A Mode: External Baseline Rate (A): 145 bpm Variability: Moderate Accelerations: 10 x 10 Decelerations: Variable Multiple birth?: No  Uterine Activity Mode: Palpation, Toco Contraction Frequency (min): none Resting Tone Palpated: Relaxed  Interpretation (Fetal Testing) Nonstress Test Interpretation: Reactive Overall Impression: Reassuring for gestational age Comments: Dr. Annamaria Boots reviewed tracing

## 2022-02-02 ENCOUNTER — Ambulatory Visit: Payer: Medicaid Other | Attending: Obstetrics and Gynecology

## 2022-02-02 ENCOUNTER — Ambulatory Visit: Payer: Medicaid Other | Admitting: *Deleted

## 2022-02-02 ENCOUNTER — Ambulatory Visit (HOSPITAL_BASED_OUTPATIENT_CLINIC_OR_DEPARTMENT_OTHER): Payer: Medicaid Other | Admitting: *Deleted

## 2022-02-02 VITALS — BP 135/72 | HR 99

## 2022-02-02 DIAGNOSIS — E0590 Thyrotoxicosis, unspecified without thyrotoxic crisis or storm: Secondary | ICD-10-CM

## 2022-02-02 DIAGNOSIS — O365930 Maternal care for other known or suspected poor fetal growth, third trimester, not applicable or unspecified: Secondary | ICD-10-CM | POA: Diagnosis present

## 2022-02-02 DIAGNOSIS — O365990 Maternal care for other known or suspected poor fetal growth, unspecified trimester, not applicable or unspecified: Secondary | ICD-10-CM | POA: Diagnosis present

## 2022-02-02 DIAGNOSIS — Z3A30 30 weeks gestation of pregnancy: Secondary | ICD-10-CM | POA: Diagnosis present

## 2022-02-02 DIAGNOSIS — O99280 Endocrine, nutritional and metabolic diseases complicating pregnancy, unspecified trimester: Secondary | ICD-10-CM | POA: Diagnosis not present

## 2022-02-02 DIAGNOSIS — Z8489 Family history of other specified conditions: Secondary | ICD-10-CM | POA: Diagnosis not present

## 2022-02-02 NOTE — Procedures (Signed)
Lisa Calderon 12-02-2000 [redacted]w[redacted]d  Fetus A Non-Stress Test Interpretation for 02/02/22  Indication: IUGR  Fetal Heart Rate A Mode: External Baseline Rate (A): 140 bpm Variability: Moderate Accelerations: 10 x 10 Decelerations: None Multiple birth?: No  Uterine Activity Mode: Palpation, Toco Contraction Frequency (min): none Resting Tone Palpated: Relaxed  Interpretation (Fetal Testing) Nonstress Test Interpretation: Reactive Overall Impression: Reassuring for gestational age Comments: Dr. Judeth Cornfield reviewed tracing

## 2022-02-09 ENCOUNTER — Ambulatory Visit: Payer: Medicaid Other | Attending: Obstetrics and Gynecology

## 2022-02-09 ENCOUNTER — Encounter: Payer: Self-pay | Admitting: *Deleted

## 2022-02-09 ENCOUNTER — Ambulatory Visit: Payer: Medicaid Other | Admitting: *Deleted

## 2022-02-09 ENCOUNTER — Ambulatory Visit (HOSPITAL_BASED_OUTPATIENT_CLINIC_OR_DEPARTMENT_OTHER): Payer: Medicaid Other | Admitting: *Deleted

## 2022-02-09 VITALS — BP 138/76 | HR 86

## 2022-02-09 DIAGNOSIS — E0590 Thyrotoxicosis, unspecified without thyrotoxic crisis or storm: Secondary | ICD-10-CM

## 2022-02-09 DIAGNOSIS — O365930 Maternal care for other known or suspected poor fetal growth, third trimester, not applicable or unspecified: Secondary | ICD-10-CM

## 2022-02-09 DIAGNOSIS — Z3A31 31 weeks gestation of pregnancy: Secondary | ICD-10-CM

## 2022-02-09 DIAGNOSIS — O09893 Supervision of other high risk pregnancies, third trimester: Secondary | ICD-10-CM

## 2022-02-09 DIAGNOSIS — O99283 Endocrine, nutritional and metabolic diseases complicating pregnancy, third trimester: Secondary | ICD-10-CM

## 2022-02-09 DIAGNOSIS — O99280 Endocrine, nutritional and metabolic diseases complicating pregnancy, unspecified trimester: Secondary | ICD-10-CM

## 2022-02-09 DIAGNOSIS — O365990 Maternal care for other known or suspected poor fetal growth, unspecified trimester, not applicable or unspecified: Secondary | ICD-10-CM | POA: Insufficient documentation

## 2022-02-09 DIAGNOSIS — Z8489 Family history of other specified conditions: Secondary | ICD-10-CM

## 2022-02-09 NOTE — Procedures (Signed)
Lisa Calderon 2000/10/19 [redacted]w[redacted]d  Fetus A Non-Stress Test Interpretation for 02/09/22  Indication: IUGR  Fetal Heart Rate A Mode: External Baseline Rate (A): 140 bpm Variability: Moderate Accelerations: 10 x 10 Decelerations: None Multiple birth?: No  Uterine Activity Mode: Palpation, Toco Contraction Frequency (min): none Resting Tone Palpated: Relaxed  Interpretation (Fetal Testing) Nonstress Test Interpretation: Reactive Overall Impression: Reassuring for gestational age Comments: Dr. Parke Poisson reviewed tracing

## 2022-02-12 ENCOUNTER — Other Ambulatory Visit: Payer: Self-pay | Admitting: *Deleted

## 2022-02-12 DIAGNOSIS — O365930 Maternal care for other known or suspected poor fetal growth, third trimester, not applicable or unspecified: Secondary | ICD-10-CM

## 2022-02-14 ENCOUNTER — Other Ambulatory Visit: Payer: Self-pay | Admitting: *Deleted

## 2022-02-14 DIAGNOSIS — O365931 Maternal care for other known or suspected poor fetal growth, third trimester, fetus 1: Secondary | ICD-10-CM

## 2022-02-15 ENCOUNTER — Ambulatory Visit: Payer: Medicaid Other | Admitting: *Deleted

## 2022-02-15 ENCOUNTER — Other Ambulatory Visit: Payer: Self-pay | Admitting: *Deleted

## 2022-02-15 ENCOUNTER — Other Ambulatory Visit: Payer: Self-pay | Admitting: Obstetrics

## 2022-02-15 ENCOUNTER — Encounter: Payer: Self-pay | Admitting: *Deleted

## 2022-02-15 ENCOUNTER — Ambulatory Visit: Payer: Medicaid Other | Attending: Obstetrics and Gynecology

## 2022-02-15 VITALS — BP 129/81 | HR 101

## 2022-02-15 DIAGNOSIS — Z3A32 32 weeks gestation of pregnancy: Secondary | ICD-10-CM | POA: Diagnosis not present

## 2022-02-15 DIAGNOSIS — O99283 Endocrine, nutritional and metabolic diseases complicating pregnancy, third trimester: Secondary | ICD-10-CM

## 2022-02-15 DIAGNOSIS — O365931 Maternal care for other known or suspected poor fetal growth, third trimester, fetus 1: Secondary | ICD-10-CM

## 2022-02-15 DIAGNOSIS — E0590 Thyrotoxicosis, unspecified without thyrotoxic crisis or storm: Secondary | ICD-10-CM

## 2022-02-15 DIAGNOSIS — Z8489 Family history of other specified conditions: Secondary | ICD-10-CM

## 2022-02-15 DIAGNOSIS — O365990 Maternal care for other known or suspected poor fetal growth, unspecified trimester, not applicable or unspecified: Secondary | ICD-10-CM

## 2022-02-15 DIAGNOSIS — O365930 Maternal care for other known or suspected poor fetal growth, third trimester, not applicable or unspecified: Secondary | ICD-10-CM

## 2022-02-15 NOTE — Procedures (Signed)
Lisa Calderon 2001-01-07 [redacted]w[redacted]d  Fetus A Non-Stress Test Interpretation for 02/15/22  Indication: IUGR  Fetal Heart Rate A Mode: External Baseline Rate (A): 140 bpm Variability: Moderate Accelerations: 15 x 15 Decelerations: None Multiple birth?: No  Uterine Activity Mode: Toco Contraction Frequency (min): UI Resting Tone Palpated: Relaxed  Interpretation (Fetal Testing) Nonstress Test Interpretation: Reactive Overall Impression: Reassuring for gestational age Comments: tracing reviewed by Dr. Judeth Cornfield

## 2022-02-21 ENCOUNTER — Other Ambulatory Visit: Payer: Self-pay | Admitting: *Deleted

## 2022-02-21 ENCOUNTER — Ambulatory Visit: Payer: Medicaid Other | Admitting: *Deleted

## 2022-02-21 ENCOUNTER — Ambulatory Visit: Payer: Medicaid Other | Attending: Obstetrics

## 2022-02-21 ENCOUNTER — Encounter: Payer: Self-pay | Admitting: *Deleted

## 2022-02-21 VITALS — BP 137/76 | HR 99

## 2022-02-21 DIAGNOSIS — O365931 Maternal care for other known or suspected poor fetal growth, third trimester, fetus 1: Secondary | ICD-10-CM | POA: Diagnosis present

## 2022-02-21 DIAGNOSIS — E0590 Thyrotoxicosis, unspecified without thyrotoxic crisis or storm: Secondary | ICD-10-CM

## 2022-02-21 DIAGNOSIS — O99283 Endocrine, nutritional and metabolic diseases complicating pregnancy, third trimester: Secondary | ICD-10-CM | POA: Diagnosis not present

## 2022-02-21 DIAGNOSIS — O365930 Maternal care for other known or suspected poor fetal growth, third trimester, not applicable or unspecified: Secondary | ICD-10-CM

## 2022-02-21 DIAGNOSIS — O09893 Supervision of other high risk pregnancies, third trimester: Secondary | ICD-10-CM

## 2022-02-21 DIAGNOSIS — Z3A33 33 weeks gestation of pregnancy: Secondary | ICD-10-CM

## 2022-02-21 DIAGNOSIS — Z8489 Family history of other specified conditions: Secondary | ICD-10-CM

## 2022-02-21 NOTE — Procedures (Signed)
Kayleeann Huxford 2001-08-23 [redacted]w[redacted]d  Fetus A Non-Stress Test Interpretation for 02/21/22  Indication: IUGR  Fetal Heart Rate A Mode: External Baseline Rate (A): 140 bpm Variability: Moderate Accelerations: 15 x 15 Decelerations: None Multiple birth?: No  Uterine Activity Mode: Palpation, Toco Contraction Frequency (min): ui Resting Tone Palpated: Relaxed  Interpretation (Fetal Testing) Nonstress Test Interpretation: Reactive Overall Impression: Reassuring for gestational age Comments: Dr. Judeth Cornfield reviewed tracing

## 2022-03-02 ENCOUNTER — Ambulatory Visit: Payer: Medicaid Other | Admitting: *Deleted

## 2022-03-02 ENCOUNTER — Ambulatory Visit: Payer: Medicaid Other | Attending: Obstetrics

## 2022-03-02 VITALS — BP 133/78 | HR 86

## 2022-03-02 DIAGNOSIS — O365931 Maternal care for other known or suspected poor fetal growth, third trimester, fetus 1: Secondary | ICD-10-CM | POA: Insufficient documentation

## 2022-03-02 DIAGNOSIS — Z3A34 34 weeks gestation of pregnancy: Secondary | ICD-10-CM

## 2022-03-02 DIAGNOSIS — O09293 Supervision of pregnancy with other poor reproductive or obstetric history, third trimester: Secondary | ICD-10-CM | POA: Diagnosis not present

## 2022-03-02 DIAGNOSIS — O365930 Maternal care for other known or suspected poor fetal growth, third trimester, not applicable or unspecified: Secondary | ICD-10-CM

## 2022-03-02 DIAGNOSIS — E0590 Thyrotoxicosis, unspecified without thyrotoxic crisis or storm: Secondary | ICD-10-CM | POA: Diagnosis not present

## 2022-03-02 DIAGNOSIS — O99283 Endocrine, nutritional and metabolic diseases complicating pregnancy, third trimester: Secondary | ICD-10-CM

## 2022-03-02 NOTE — Procedures (Signed)
Lisa Calderon Feb 16, 2001 [redacted]w[redacted]d  Fetus A Non-Stress Test Interpretation for 03/02/22  Indication: IUGR  Fetal Heart Rate A Mode: External Baseline Rate (A): 145 bpm Variability: Moderate Accelerations: 15 x 15 Decelerations: None Multiple birth?: No  Uterine Activity Mode: Toco Contraction Frequency (min): UI only Resting Tone Palpated: Relaxed  Interpretation (Fetal Testing) Nonstress Test Interpretation: Reactive Overall Impression: Reassuring for gestational age Comments: tracing reviewed by Dr. Parke Poisson

## 2022-03-06 LAB — OB RESULTS CONSOLE GBS: GBS: POSITIVE

## 2022-03-09 ENCOUNTER — Ambulatory Visit: Payer: Medicaid Other | Admitting: *Deleted

## 2022-03-09 ENCOUNTER — Ambulatory Visit: Payer: Medicaid Other | Attending: Obstetrics and Gynecology

## 2022-03-09 DIAGNOSIS — O09891 Supervision of other high risk pregnancies, first trimester: Secondary | ICD-10-CM

## 2022-03-09 DIAGNOSIS — Z3A35 35 weeks gestation of pregnancy: Secondary | ICD-10-CM

## 2022-03-09 DIAGNOSIS — O365930 Maternal care for other known or suspected poor fetal growth, third trimester, not applicable or unspecified: Secondary | ICD-10-CM | POA: Diagnosis not present

## 2022-03-09 DIAGNOSIS — O365931 Maternal care for other known or suspected poor fetal growth, third trimester, fetus 1: Secondary | ICD-10-CM

## 2022-03-09 DIAGNOSIS — Z8489 Family history of other specified conditions: Secondary | ICD-10-CM

## 2022-03-09 DIAGNOSIS — E0590 Thyrotoxicosis, unspecified without thyrotoxic crisis or storm: Secondary | ICD-10-CM | POA: Diagnosis not present

## 2022-03-09 DIAGNOSIS — O99283 Endocrine, nutritional and metabolic diseases complicating pregnancy, third trimester: Secondary | ICD-10-CM | POA: Diagnosis not present

## 2022-03-09 NOTE — Procedures (Signed)
Lisa Calderon 02/24/2001 [redacted]w[redacted]d  Fetus A Non-Stress Test Interpretation for 03/09/22  Indication: IUGR  Fetal Heart Rate A Mode: External Baseline Rate (A): 145 bpm Variability: Moderate Accelerations: 15 x 15 Decelerations: None Multiple birth?: No  Uterine Activity Mode: Toco Contraction Frequency (min): none Resting Tone Palpated: Relaxed  Interpretation (Fetal Testing) Nonstress Test Interpretation: Reactive Overall Impression: Reassuring for gestational age Comments: tracing reviewed

## 2022-03-14 ENCOUNTER — Encounter (HOSPITAL_COMMUNITY): Payer: Self-pay | Admitting: *Deleted

## 2022-03-14 ENCOUNTER — Telehealth (HOSPITAL_COMMUNITY): Payer: Self-pay | Admitting: *Deleted

## 2022-03-14 NOTE — Telephone Encounter (Signed)
Preadmission screen  

## 2022-03-15 ENCOUNTER — Ambulatory Visit: Payer: Medicaid Other | Admitting: *Deleted

## 2022-03-15 ENCOUNTER — Ambulatory Visit: Payer: Medicaid Other | Attending: Obstetrics and Gynecology

## 2022-03-15 VITALS — BP 137/80 | HR 78

## 2022-03-15 DIAGNOSIS — O365931 Maternal care for other known or suspected poor fetal growth, third trimester, fetus 1: Secondary | ICD-10-CM | POA: Diagnosis present

## 2022-03-15 DIAGNOSIS — O09893 Supervision of other high risk pregnancies, third trimester: Secondary | ICD-10-CM

## 2022-03-15 DIAGNOSIS — O365930 Maternal care for other known or suspected poor fetal growth, third trimester, not applicable or unspecified: Secondary | ICD-10-CM | POA: Diagnosis present

## 2022-03-15 DIAGNOSIS — O99283 Endocrine, nutritional and metabolic diseases complicating pregnancy, third trimester: Secondary | ICD-10-CM | POA: Diagnosis not present

## 2022-03-15 DIAGNOSIS — E0590 Thyrotoxicosis, unspecified without thyrotoxic crisis or storm: Secondary | ICD-10-CM | POA: Diagnosis not present

## 2022-03-15 DIAGNOSIS — Z3A36 36 weeks gestation of pregnancy: Secondary | ICD-10-CM

## 2022-03-15 NOTE — Procedures (Signed)
Lisa Calderon Jan 11, 2001 [redacted]w[redacted]d  Fetus A Non-Stress Test Interpretation for 03/15/22  Indication: IUGR  Fetal Heart Rate A Mode: External Baseline Rate (A): 145 bpm Variability: Moderate Accelerations: 15 x 15 Decelerations: None Multiple birth?: No  Uterine Activity Mode: Palpation, Toco Contraction Frequency (min): 1 uc with ui Contraction Duration (sec): 60 Contraction Quality: Mild Resting Tone Palpated: Relaxed  Interpretation (Fetal Testing) Nonstress Test Interpretation: Reactive Overall Impression: Reassuring for gestational age Comments: Dr. Judeth Cornfield reviewed tracing

## 2022-03-18 ENCOUNTER — Other Ambulatory Visit: Payer: Self-pay | Admitting: Obstetrics and Gynecology

## 2022-03-18 DIAGNOSIS — O365930 Maternal care for other known or suspected poor fetal growth, third trimester, not applicable or unspecified: Secondary | ICD-10-CM

## 2022-03-19 ENCOUNTER — Inpatient Hospital Stay (HOSPITAL_COMMUNITY)
Admission: AD | Admit: 2022-03-19 | Discharge: 2022-03-24 | DRG: 787 | Disposition: A | Discharge status: Home / Self Care | Payer: Medicaid Other | Attending: Obstetrics and Gynecology | Admitting: Obstetrics and Gynecology

## 2022-03-19 ENCOUNTER — Encounter (HOSPITAL_COMMUNITY): Payer: Self-pay | Admitting: Obstetrics and Gynecology

## 2022-03-19 ENCOUNTER — Inpatient Hospital Stay (HOSPITAL_COMMUNITY): Payer: Medicaid Other

## 2022-03-19 ENCOUNTER — Other Ambulatory Visit: Payer: Self-pay

## 2022-03-19 DIAGNOSIS — O365990 Maternal care for other known or suspected poor fetal growth, unspecified trimester, not applicable or unspecified: Secondary | ICD-10-CM | POA: Diagnosis present

## 2022-03-19 DIAGNOSIS — O326XX0 Maternal care for compound presentation, not applicable or unspecified: Secondary | ICD-10-CM | POA: Diagnosis present

## 2022-03-19 DIAGNOSIS — O368390 Maternal care for abnormalities of the fetal heart rate or rhythm, unspecified trimester, not applicable or unspecified: Secondary | ICD-10-CM | POA: Diagnosis not present

## 2022-03-19 DIAGNOSIS — O1493 Unspecified pre-eclampsia, third trimester: Secondary | ICD-10-CM | POA: Diagnosis present

## 2022-03-19 DIAGNOSIS — Z3A37 37 weeks gestation of pregnancy: Secondary | ICD-10-CM | POA: Diagnosis not present

## 2022-03-19 DIAGNOSIS — O1404 Mild to moderate pre-eclampsia, complicating childbirth: Secondary | ICD-10-CM | POA: Diagnosis present

## 2022-03-19 DIAGNOSIS — O99824 Streptococcus B carrier state complicating childbirth: Secondary | ICD-10-CM | POA: Diagnosis present

## 2022-03-19 DIAGNOSIS — D62 Acute posthemorrhagic anemia: Secondary | ICD-10-CM | POA: Diagnosis not present

## 2022-03-19 DIAGNOSIS — O365930 Maternal care for other known or suspected poor fetal growth, third trimester, not applicable or unspecified: Principal | ICD-10-CM | POA: Diagnosis present

## 2022-03-19 DIAGNOSIS — Z98891 History of uterine scar from previous surgery: Secondary | ICD-10-CM

## 2022-03-19 DIAGNOSIS — O9081 Anemia of the puerperium: Secondary | ICD-10-CM | POA: Diagnosis present

## 2022-03-19 LAB — COMPREHENSIVE METABOLIC PANEL WITH GFR
ALT: 11 U/L (ref 0–44)
AST: 24 U/L (ref 15–41)
Albumin: 2.8 g/dL — ABNORMAL LOW (ref 3.5–5.0)
Alkaline Phosphatase: 83 U/L (ref 38–126)
Anion gap: 8 (ref 5–15)
BUN: 8 mg/dL (ref 6–20)
CO2: 19 mmol/L — ABNORMAL LOW (ref 22–32)
Calcium: 8.3 mg/dL — ABNORMAL LOW (ref 8.9–10.3)
Chloride: 106 mmol/L (ref 98–111)
Creatinine, Ser: 0.63 mg/dL (ref 0.44–1.00)
GFR, Estimated: >60 mL/min
Glucose, Bld: 91 mg/dL (ref 70–99)
Potassium: 4.4 mmol/L (ref 3.5–5.1)
Sodium: 133 mmol/L — ABNORMAL LOW (ref 135–145)
Total Bilirubin: 0.6 mg/dL (ref 0.3–1.2)
Total Protein: 6.8 g/dL (ref 6.5–8.1)

## 2022-03-19 LAB — PROTEIN / CREATININE RATIO, URINE
Creatinine, Urine: 126 mg/dL
Protein Creatinine Ratio: 0.05 mg/mg{creat} (ref 0.00–0.15)
Total Protein, Urine: 6 mg/dL

## 2022-03-19 LAB — CBC
HCT: 32.6 % — ABNORMAL LOW (ref 36.0–46.0)
Hemoglobin: 11.2 g/dL — ABNORMAL LOW (ref 12.0–15.0)
MCH: 29.9 pg (ref 26.0–34.0)
MCHC: 34.4 g/dL (ref 30.0–36.0)
MCV: 86.9 fL (ref 80.0–100.0)
Platelets: 281 10*3/uL (ref 150–400)
RBC: 3.75 MIL/uL — ABNORMAL LOW (ref 3.87–5.11)
RDW: 12.7 % (ref 11.5–15.5)
WBC: 12.1 10*3/uL — ABNORMAL HIGH (ref 4.0–10.5)
nRBC: 0 % (ref 0.0–0.2)

## 2022-03-19 LAB — TYPE AND SCREEN
ABO/RH(D): O POS
Antibody Screen: NEGATIVE

## 2022-03-19 LAB — RPR: RPR Ser Ql: NONREACTIVE

## 2022-03-19 MED ORDER — PENICILLIN G POT IN DEXTROSE 60000 UNIT/ML IV SOLN
3.0000 10*6.[IU] | INTRAVENOUS | Status: DC
  Administered 2022-03-19 – 2022-03-21 (×10): 3 10*6.[IU] via INTRAVENOUS
  Filled 2022-03-19 (×10): qty 50

## 2022-03-19 MED ORDER — MISOPROSTOL 25 MCG QUARTER TABLET
25.0000 ug | ORAL_TABLET | ORAL | Status: DC
Start: 2022-03-19 — End: 2022-03-21
  Administered 2022-03-19 – 2022-03-20 (×2): 25 ug via VAGINAL
  Filled 2022-03-19 (×3): qty 1

## 2022-03-19 MED ORDER — LIDOCAINE HCL (PF) 1 % IJ SOLN: 30.0000 mL | INTRAMUSCULAR | Status: DC | PRN

## 2022-03-19 MED ORDER — SODIUM CHLORIDE 0.9 % IV SOLN
5.0000 10*6.[IU] | Freq: Once | INTRAVENOUS | Status: AC
  Administered 2022-03-19: 5 10*6.[IU] via INTRAVENOUS
  Filled 2022-03-19: qty 5

## 2022-03-19 MED ORDER — HYDROXYZINE HCL 50 MG PO TABS: 50.0000 mg | ORAL_TABLET | Freq: Four times a day (QID) | ORAL | Status: DC | PRN

## 2022-03-19 MED ORDER — ONDANSETRON HCL 4 MG/2ML IJ SOLN
4.0000 mg | Freq: Four times a day (QID) | INTRAMUSCULAR | Status: DC | PRN
  Administered 2022-03-20: 4 mg via INTRAVENOUS
  Filled 2022-03-19: qty 2

## 2022-03-19 MED ORDER — OXYTOCIN-SODIUM CHLORIDE 30-0.9 UT/500ML-% IV SOLN: 2.5000 [IU]/h | INTRAVENOUS | Status: DC

## 2022-03-19 MED ORDER — TERBUTALINE SULFATE 1 MG/ML IJ SOLN
0.2500 mg | Freq: Once | INTRAMUSCULAR | Status: DC | PRN
  Filled 2022-03-19: qty 1

## 2022-03-19 MED ORDER — FENTANYL CITRATE (PF) 100 MCG/2ML IJ SOLN
50.0000 ug | INTRAMUSCULAR | Status: DC | PRN
  Administered 2022-03-19: 50 ug via INTRAVENOUS
  Administered 2022-03-20: 100 ug via INTRAVENOUS
  Administered 2022-03-20: 50 ug via INTRAVENOUS
  Administered 2022-03-20: 100 ug via INTRAVENOUS
  Filled 2022-03-19 (×4): qty 2

## 2022-03-19 MED ORDER — SOD CITRATE-CITRIC ACID 500-334 MG/5ML PO SOLN
30.0000 mL | ORAL | Status: DC | PRN
  Administered 2022-03-21: 30 mL via ORAL
  Filled 2022-03-19: qty 30

## 2022-03-19 MED ORDER — LACTATED RINGERS IV SOLN: INTRAVENOUS | Status: DC

## 2022-03-19 MED ORDER — OXYTOCIN BOLUS FROM INFUSION: 333.0000 mL | Freq: Once | INTRAVENOUS | Status: DC

## 2022-03-19 MED ORDER — OXYCODONE-ACETAMINOPHEN 5-325 MG PO TABS: 2.0000 | ORAL_TABLET | ORAL | Status: DC | PRN

## 2022-03-19 MED ORDER — MISOPROSTOL 50MCG HALF TABLET
50.0000 ug | ORAL_TABLET | ORAL | Status: DC | PRN
  Administered 2022-03-19 (×2): 50 ug via ORAL
  Filled 2022-03-19 (×3): qty 1

## 2022-03-19 MED ORDER — ACETAMINOPHEN 325 MG PO TABS: 650.0000 mg | ORAL_TABLET | ORAL | Status: DC | PRN

## 2022-03-19 MED ORDER — OXYCODONE-ACETAMINOPHEN 5-325 MG PO TABS: 1.0000 | ORAL_TABLET | ORAL | Status: DC | PRN

## 2022-03-19 MED ORDER — LACTATED RINGERS IV SOLN: 500.0000 mL | INTRAVENOUS | Status: DC | PRN

## 2022-03-19 NOTE — H&P (Signed)
Lisa Calderon is a 21 y.o. female G1P0 at 37 weeks and 1 day  presenting for induction of labor due to IUGR .  Pregnancy complicated by h/o hyperthyroidism currently euthyroid without medication.    Prenatal care provided by Dr. Gerald Leitz with [facility].  03/02/2022 u/s for efw was 3 lbs 13 oz less than 1%ile. Placenta is Anterior.  Maternal fetal medicine recommended delivery at [redacted] weeks EGA .  OB History     Gravida  1   Para      Term      Preterm      AB      Living         SAB      IAB      Ectopic      Multiple      Live Births             Past Medical History:  Diagnosis Date   Hyperthyroidism    Ovarian cyst    Past Surgical History:  Procedure Laterality Date   NO PAST SURGERIES     Family History: family history is not on file. Social History:  reports that she has never smoked. She has been exposed to tobacco smoke. She does not have any smokeless tobacco history on file. She reports that she does not currently use alcohol. She reports that she does not use drugs.     Maternal Diabetes: No Genetic Screening: Normal Maternal Ultrasounds/Referrals: IUGR Fetal Ultrasounds or other Referrals:  None Maternal Substance Abuse:  No Significant Maternal Medications:  None Significant Maternal Lab Results:  Group B Strep positive Number of Prenatal Visits:greater than 3 verified prenatal visits Other Comments:  None  Review of Systems  Constitutional: Negative.   HENT: Negative.    Eyes: Negative.   Respiratory: Negative.    Cardiovascular: Negative.   Gastrointestinal: Negative.   Endocrine: Negative.   Genitourinary: Negative.   Musculoskeletal: Negative.   Skin: Negative.   Allergic/Immunologic: Negative.   Neurological: Negative.   Hematological: Negative.   Psychiatric/Behavioral: Negative.     History Dilation: Closed Effacement (%): Thick Station: -3 Exam by:: Richardson Dopp, MD Blood pressure 133/66, pulse 89, temperature 98.4 F  (36.9 C), temperature source Oral, resp. rate 16, height 5\' 4"  (1.626 m), weight 81.4 kg, last menstrual period 07/02/2021. Maternal Exam:  Introitus: Normal vulva.   Physical Exam Vitals reviewed.  Constitutional:      Appearance: Normal appearance.  HENT:     Head: Normocephalic and atraumatic.     Nose: Nose normal.     Mouth/Throat:     Mouth: Mucous membranes are moist.  Cardiovascular:     Rate and Rhythm: Normal rate and regular rhythm.     Pulses: Normal pulses.     Heart sounds: Normal heart sounds.  Pulmonary:     Effort: Pulmonary effort is normal.  Abdominal:     Tenderness: There is no abdominal tenderness.  Genitourinary:    General: Normal vulva.  Musculoskeletal:        General: No swelling.     Cervical back: Normal range of motion and neck supple.  Skin:    General: Skin is warm and dry.  Neurological:     General: No focal deficit present.     Mental Status: She is alert and oriented to person, place, and time.  Psychiatric:        Mood and Affect: Mood normal.        Behavior: Behavior normal.  Cx: closed thick high   Prenatal labs: ABO, Rh: --/--/O POS (07/24 4580) Antibody: NEG (07/24 0748) Rubella: Immune (12/14 0000) RPR: NON REACTIVE (07/24 0740)  HBsAg: Negative (12/14 0000)  HIV: Non-reactive (12/14 0000)  GBS: Positive/-- (07/11 0000)   Assessment/Plan: 37 weeks and 1 day with severe IUGR for induction  - cytotec 50 mcg buccal for cervical ripening.  - plan penicillin for gbs prophylaxis  - Category 1 tracing.   - anticipate svd  Gerald Leitz 03/19/2022, 5:27 PM

## 2022-03-19 NOTE — Progress Notes (Signed)
   Subjective: Patient reports feeling a contraction every 10 minutes. +Fm no lof no vaginal bleeding   Objective: BP 139/80   Pulse 78   Temp 98.3 F (36.8 C) (Oral)   Resp 17   Ht 5\' 4"  (1.626 m)   Wt 81.4 kg   LMP 07/02/2021   BMI 30.81 kg/m  No intake/output data recorded. No intake/output data recorded.  FHT:  FHR: 130's bpm, variability: moderate,  accelerations:  Present,  decelerations:  Absent UC:   irregular, every 10  minutes SVE:   Dilation: Closed Effacement (%): Thick Station: -3 Exam by:: [email or web address], RN  Labs: Lab Results  Component Value Date   WBC 12.1 (H) 03/19/2022   HGB 11.2 (L) 03/19/2022   HCT 32.6 (L) 03/19/2022   MCV 86.9 03/19/2022   PLT 281 03/19/2022    Assessment / Plan: 37 weeks and 1 day for induction of labor due to IUGR  Labor:  s/p cytotec 50 mcg buccal x 2.. plan to place cytotec 25 mcg per vagina for this dose.  Preeclampsia:   NA Fetal Wellbeing:  Category I Pain Control:  Labor support without medications I/D:   Penicillin  Anticipated MOD:  NSVD  03/21/2022, MD 03/19/2022, 5:38 PM

## 2022-03-19 NOTE — Progress Notes (Signed)
FHT Review:  Baseline 145bpm, moderate variability, + accel, - decels. Contractions not continuous - patient on birthing ball and walking. Contractions not graphing well on toco.  Steva Ready, DO

## 2022-03-20 ENCOUNTER — Encounter (HOSPITAL_COMMUNITY): Payer: Self-pay | Admitting: Obstetrics and Gynecology

## 2022-03-20 ENCOUNTER — Inpatient Hospital Stay (HOSPITAL_COMMUNITY): Payer: Medicaid Other | Admitting: Anesthesiology

## 2022-03-20 LAB — CBC
HCT: 32.3 % — ABNORMAL LOW (ref 36.0–46.0)
Hemoglobin: 10.8 g/dL — ABNORMAL LOW (ref 12.0–15.0)
MCH: 29.6 pg (ref 26.0–34.0)
MCHC: 33.4 g/dL (ref 30.0–36.0)
MCV: 88.5 fL (ref 80.0–100.0)
Platelets: 266 10*3/uL (ref 150–400)
RBC: 3.65 MIL/uL — ABNORMAL LOW (ref 3.87–5.11)
RDW: 12.5 % (ref 11.5–15.5)
WBC: 13.6 10*3/uL — ABNORMAL HIGH (ref 4.0–10.5)
nRBC: 0 % (ref 0.0–0.2)

## 2022-03-20 LAB — COMPREHENSIVE METABOLIC PANEL
ALT: 11 U/L (ref 0–44)
AST: 18 U/L (ref 15–41)
Albumin: 2.5 g/dL — ABNORMAL LOW (ref 3.5–5.0)
Alkaline Phosphatase: 87 U/L (ref 38–126)
Anion gap: 9 (ref 5–15)
BUN: <5 mg/dL — ABNORMAL LOW (ref 6–20)
CO2: 22 mmol/L (ref 22–32)
Calcium: 8.7 mg/dL — ABNORMAL LOW (ref 8.9–10.3)
Chloride: 107 mmol/L (ref 98–111)
Creatinine, Ser: 0.5 mg/dL (ref 0.44–1.00)
GFR, Estimated: >60 mL/min (ref >60)
Glucose, Bld: 72 mg/dL (ref 70–99)
Potassium: 3.8 mmol/L (ref 3.5–5.1)
Sodium: 138 mmol/L (ref 135–145)
Total Bilirubin: 0.3 mg/dL (ref 0.3–1.2)
Total Protein: 6.5 g/dL (ref 6.5–8.1)

## 2022-03-20 LAB — PROTEIN / CREATININE RATIO, URINE
Creatinine, Urine: 15 mg/dL
Protein Creatinine Ratio: 1.6 mg/mg{Cre} — ABNORMAL HIGH (ref 0.00–0.15)
Total Protein, Urine: 24 mg/dL

## 2022-03-20 MED ORDER — LIDOCAINE HCL (PF) 1 % IJ SOLN
INTRAMUSCULAR | Status: DC | PRN
  Administered 2022-03-20: 10 mL via EPIDURAL

## 2022-03-20 MED ORDER — FENTANYL-BUPIVACAINE-NACL 0.5-0.125-0.9 MG/250ML-% EP SOLN: 12.0000 mL/h | EPIDURAL | Status: DC | PRN

## 2022-03-20 MED ORDER — OXYTOCIN-SODIUM CHLORIDE 30-0.9 UT/500ML-% IV SOLN
1.0000 m[IU]/min | INTRAVENOUS | Status: DC
  Administered 2022-03-20: 2 m[IU]/min via INTRAVENOUS
  Filled 2022-03-20: qty 500

## 2022-03-20 MED ORDER — LACTATED RINGERS IV SOLN: 500.0000 mL | Freq: Once | INTRAVENOUS | Status: DC

## 2022-03-20 MED ORDER — FENTANYL-BUPIVACAINE-NACL 0.5-0.125-0.9 MG/250ML-% EP SOLN
12.0000 mL/h | EPIDURAL | Status: DC | PRN
  Administered 2022-03-20: 12 mL/h via EPIDURAL
  Filled 2022-03-20: qty 250

## 2022-03-20 MED ORDER — EPHEDRINE 5 MG/ML INJ: 10.0000 mg | INTRAVENOUS | Status: DC | PRN

## 2022-03-20 MED ORDER — TERBUTALINE SULFATE 1 MG/ML IJ SOLN
0.2500 mg | Freq: Once | INTRAMUSCULAR | Status: AC | PRN
  Administered 2022-03-21: 0.25 mg via SUBCUTANEOUS

## 2022-03-20 MED ORDER — DIPHENHYDRAMINE HCL 50 MG/ML IJ SOLN: 12.5000 mg | INTRAMUSCULAR | Status: DC | PRN

## 2022-03-20 MED ORDER — OXYTOCIN-SODIUM CHLORIDE 30-0.9 UT/500ML-% IV SOLN
1.0000 m[IU]/min | INTRAVENOUS | Status: DC
  Administered 2022-03-20: 1 m[IU]/min via INTRAVENOUS

## 2022-03-20 MED ORDER — PHENYLEPHRINE 80 MCG/ML (10ML) SYRINGE FOR IV PUSH (FOR BLOOD PRESSURE SUPPORT)
80.0000 ug | PREFILLED_SYRINGE | INTRAVENOUS | Status: DC | PRN
  Filled 2022-03-20: qty 10

## 2022-03-20 MED ORDER — LACTATED RINGERS AMNIOINFUSION: INTRAVENOUS | Status: DC

## 2022-03-20 MED ORDER — PHENYLEPHRINE 80 MCG/ML (10ML) SYRINGE FOR IV PUSH (FOR BLOOD PRESSURE SUPPORT): 80.0000 ug | PREFILLED_SYRINGE | INTRAVENOUS | Status: DC | PRN

## 2022-03-20 NOTE — Anesthesia Procedure Notes (Signed)
Epidural Patient location during procedure: OB Start time: 03/20/2022 5:00 PM End time: 03/20/2022 5:05 PM  Staffing Anesthesiologist: Bethena Midget, MD  Preanesthetic Checklist Completed: patient identified, IV checked, site marked, risks and benefits discussed, surgical consent, monitors and equipment checked, pre-op evaluation and timeout performed  Epidural Patient position: sitting Prep: DuraPrep and site prepped and draped Patient monitoring: continuous pulse ox and blood pressure Approach: midline Location: L3-L4 Injection technique: LOR air  Needle:  Needle type: Tuohy  Needle gauge: 17 G Needle length: 9 cm and 9 Needle insertion depth: 6 cm Catheter type: closed end flexible Catheter size: 19 Gauge Catheter at skin depth: 12 cm Test dose: negative  Assessment Events: blood not aspirated, injection not painful, no injection resistance, no paresthesia and negative IV test

## 2022-03-20 NOTE — Anesthesia Preprocedure Evaluation (Signed)
Anesthesia Evaluation  Patient identified by MRN, date of birth, ID band Patient awake    Reviewed: Allergy & Precautions, H&P , NPO status , Patient's Chart, lab work & pertinent test results, reviewed documented beta blocker date and time   Airway Mallampati: II  TM Distance: >3 FB Neck ROM: full    Dental no notable dental hx. (+) Teeth Intact, Dental Advisory Given   Pulmonary neg pulmonary ROS,    Pulmonary exam normal breath sounds clear to auscultation       Cardiovascular negative cardio ROS Normal cardiovascular exam Rhythm:regular Rate:Normal     Neuro/Psych negative neurological ROS  negative psych ROS   GI/Hepatic negative GI ROS, Neg liver ROS,   Endo/Other  negative endocrine ROS  Renal/GU negative Renal ROS  negative genitourinary   Musculoskeletal   Abdominal   Peds  Hematology negative hematology ROS (+)   Anesthesia Other Findings IUGR  Reproductive/Obstetrics (+) Pregnancy                             Anesthesia Physical Anesthesia Plan  ASA: 2  Anesthesia Plan: Epidural   Post-op Pain Management: Minimal or no pain anticipated   Induction:   PONV Risk Score and Plan: 2 and Treatment may vary due to age or medical condition  Airway Management Planned: Natural Airway  Additional Equipment: None  Intra-op Plan:   Post-operative Plan:   Informed Consent: I have reviewed the patients History and Physical, chart, labs and discussed the procedure including the risks, benefits and alternatives for the proposed anesthesia with the patient or authorized representative who has indicated his/her understanding and acceptance.       Plan Discussed with: Anesthesiologist  Anesthesia Plan Comments: (Urgent C section called by Dr. Sallye Ober due to non reassuring fetal heart tracing. Plan to dose epidural for surgical anesthesia. GETA/RSI as backup plan. Plan discussed with  patient and consent signed in labor room. Tanna Furry, MD  )       Anesthesia Quick Evaluation

## 2022-03-20 NOTE — Progress Notes (Signed)
FHT Review:  135bpm, moderate variability, - accel, - decel. Contractions are irregular. Last cervical exam 1/30/-3 at 2300 by primary RN when Cytotec per vagina was given.  Steva Ready, DO

## 2022-03-20 NOTE — Progress Notes (Signed)
Patient ID: Lisa Calderon, female   DOB: 06/21/2001, 21 y.o.   MRN: [id number] Fetal heart tracing reviewed remotely. EFM: 140 baseline, moderate variability, reactive. TOCO: Contractions every 2 to 4 minutes.  CVX: Last exam was 2 cm/80%/-3.  Continue with pitocin titration.  Dr. Hoover Browns. 03/20/2022.  10:10 PM.

## 2022-03-20 NOTE — Progress Notes (Signed)
  Subjective: Patient rates contractions as 4 out of 10 . No lof no vaginal bleeding. +Fm   Objective: BP (!) 144/92   Pulse 77   Temp 98 F (36.7 C) (Axillary)   Resp 20   Ht 5\' 4"  (1.626 m)   Wt 81.4 kg   LMP 07/02/2021   BMI 30.81 kg/m  No intake/output data recorded. No intake/output data recorded.  FHT:  FHR: 130 bpm, variability: moderate,  accelerations:  Present,  decelerations:  Absent UC:   irregular, every 2-10 minutes SVE: 1/thick/ high Foley bulb placement. Speculum placed. Cervix cleansed with betadine. Foley bulb inserted and balloon filled with 60 cc saline. Pt tolerated procedure well.   Labs: Lab Results  Component Value Date   WBC 12.1 (H) 03/19/2022   HGB 11.2 (L) 03/19/2022   HCT 32.6 (L) 03/19/2022   MCV 86.9 03/19/2022   PLT 281 03/19/2022    Assessment / Plan: 37 weeks and 2 days for induction due to severe IUGR  Labor:  pt is s/p buccal cytotec x 2 and vaginal cytotec x 2 .. Foley bulb placed.  Preeclampsia:   NA Fetal Wellbeing:  Category I Pain Control:  IV pain meds I/D:   Penicillin  Anticipated MOD:  NSVD  03/21/2022, MD 03/20/2022, 8:38 AM

## 2022-03-20 NOTE — Progress Notes (Signed)
   Subjective: In to assess patient due to deep variable decelerations after epidural resolved with position change and iv fluid bolus. Pt is comfortable with epidural. No lof no vaginal bleeding. No headache visual disturbances or ruq pain.   Objective: BP 134/75   Pulse 78   Temp 98.7 F (37.1 C) (Oral)   Resp 16   Ht 5\' 4"  (1.626 m)   Wt 81.4 kg   LMP 07/02/2021   SpO2 100%   BMI 30.81 kg/m  No intake/output data recorded. No intake/output data recorded.  FHT:  FHR: 150 bpm, variability: moderate,  accelerations:  Present,  decelerations:  Present variable... controctions not graphing well .  UC:   contractions not graphing nurse ask to readjust toco  SVE:   Dilation: 2 Effacement (%): 80 Station: -3 Exam by:: [email or web address], MD  Labs: Lab Results  Component Value Date   WBC 12.1 (H) 03/19/2022   HGB 11.2 (L) 03/19/2022   HCT 32.6 (L) 03/19/2022   MCV 86.9 03/19/2022   PLT 281 03/19/2022    Assessment / Plan: Induction of labor due to IUGR. Pt s/p cytotec 50 mcg buccal x 2 and cytotec 25 mcg per vagina x 2. Foley bulb placed but removed after 2 hours due to patient request and discomfort.   Labor:  pitocin at 8 mU.. plan AROM with fetal descent Preeclampsia:   elevated bp while in pain will repeat pIH labs to rule out preeclampsia  Fetal Wellbeing:  Category I and Category II.03/21/2022 Plan to reevaluate tracing in 30 minutes.  Pain Control:  Epidural I/D:   Penicillin Anticipated MOD:  NSVD  Marland Kitchen, MD 03/20/2022, 5:45 PM

## 2022-03-20 NOTE — Progress Notes (Signed)
  Subjective: In to assess patient due to late decelerations. Pitocin discontinued. Pt still comfortable with epidural   Objective: BP 134/75   Pulse 78   Temp 98.7 F (37.1 C) (Oral)   Resp 16   Ht 5\' 4"  (1.626 m)   Wt 81.4 kg   LMP 07/02/2021   SpO2 100%   BMI 30.81 kg/m  No intake/output data recorded. No intake/output data recorded.  FHT:  FHR: 150 bpm, variability: moderate,  accelerations:  Present,  decelerations:  Present late decels resolved with cessation of pitocin  UC:   IUPC placed.  SVE:   Dilation: 2 Effacement (%): 80 Station: -3 Exam by:: [email or web address], MD AROM clear fluid IUPC and FSE placed.  Labs: Lab Results  Component Value Date   WBC 12.1 (H) 03/19/2022   HGB 11.2 (L) 03/19/2022   HCT 32.6 (L) 03/19/2022   MCV 86.9 03/19/2022   PLT 281 03/19/2022    Assessment / Plan: Induction of labor due to IUGR.  Category 2 tracing.  Plan to monitor very closely now that internals are placed. If late decelerations reoccur  recommend cesaerean section. plan to restart pitocin 1x1 in 30 minutes if strip is Category 1  Will monitor very closely   03/21/2022, MD 03/20/2022, 6:12 PM

## 2022-03-20 NOTE — Progress Notes (Signed)
Fetal heart rate tracing reviewed.  Currently Baseline 140's with moderate variabilty. Accelerations present no decelerations resolved.  Toco Contractions every 4-5 minutes with mVU's 150 Category 1 tracing Plan to restart pitocin 1x1 to reach mvu's of approximately 200 as long as FHR remains reassuring.  Dr. Hoover Browns with [facility] to assume care at 7 pm this evening.

## 2022-03-21 ENCOUNTER — Encounter (HOSPITAL_COMMUNITY): Payer: Self-pay | Admitting: Obstetrics and Gynecology

## 2022-03-21 ENCOUNTER — Encounter (HOSPITAL_COMMUNITY)
Admission: AD | Disposition: A | Discharge status: Home / Self Care | Payer: Self-pay | Source: Home / Self Care | Attending: Obstetrics and Gynecology

## 2022-03-21 ENCOUNTER — Other Ambulatory Visit: Payer: Self-pay

## 2022-03-21 DIAGNOSIS — Z3A37 37 weeks gestation of pregnancy: Secondary | ICD-10-CM | POA: Diagnosis not present

## 2022-03-21 DIAGNOSIS — O365930 Maternal care for other known or suspected poor fetal growth, third trimester, not applicable or unspecified: Secondary | ICD-10-CM | POA: Diagnosis not present

## 2022-03-21 SURGERY — Surgical Case
Anesthesia: Epidural

## 2022-03-21 MED ORDER — PRENATAL MULTIVITAMIN CH
1.0000 | ORAL_TABLET | Freq: Every day | ORAL | Status: DC
Start: 2022-03-21 — End: 2022-03-24
  Administered 2022-03-21 – 2022-03-24 (×4): 1 via ORAL
  Filled 2022-03-21 (×4): qty 1

## 2022-03-21 MED ORDER — DIPHENHYDRAMINE HCL 25 MG PO CAPS: 25.0000 mg | ORAL_CAPSULE | Freq: Four times a day (QID) | ORAL | Status: DC | PRN

## 2022-03-21 MED ORDER — FENTANYL CITRATE (PF) 100 MCG/2ML IJ SOLN: 25.0000 ug | INTRAMUSCULAR | Status: DC | PRN

## 2022-03-21 MED ORDER — SOD CITRATE-CITRIC ACID 500-334 MG/5ML PO SOLN: 30.0000 mL | ORAL | Status: DC

## 2022-03-21 MED ORDER — NIFEDIPINE ER OSMOTIC RELEASE 30 MG PO TB24
30.0000 mg | ORAL_TABLET | Freq: Every day | ORAL | Status: DC
  Administered 2022-03-21 – 2022-03-24 (×4): 30 mg via ORAL
  Filled 2022-03-21 (×4): qty 1

## 2022-03-21 MED ORDER — ACETAMINOPHEN 500 MG PO TABS
1000.0000 mg | ORAL_TABLET | Freq: Four times a day (QID) | ORAL | Status: DC
  Administered 2022-03-21 – 2022-03-24 (×12): 1000 mg via ORAL
  Filled 2022-03-21 (×13): qty 2

## 2022-03-21 MED ORDER — MENTHOL 3 MG MT LOZG: 1.0000 | LOZENGE | OROMUCOSAL | Status: DC | PRN

## 2022-03-21 MED ORDER — MAGNESIUM HYDROXIDE 400 MG/5ML PO SUSP: 30.0000 mL | ORAL | Status: DC | PRN

## 2022-03-21 MED ORDER — MEPERIDINE HCL 25 MG/ML IJ SOLN
INTRAMUSCULAR | Status: DC | PRN
  Administered 2022-03-21 (×3): 5 mg via INTRAVENOUS
  Administered 2022-03-21: 10 mg via INTRAVENOUS

## 2022-03-21 MED ORDER — ZOLPIDEM TARTRATE 5 MG PO TABS: 5.0000 mg | ORAL_TABLET | Freq: Every evening | ORAL | Status: DC | PRN

## 2022-03-21 MED ORDER — SODIUM CHLORIDE 0.9 % IR SOLN
Status: DC | PRN
  Administered 2022-03-21 (×2): 1

## 2022-03-21 MED ORDER — CEFAZOLIN SODIUM-DEXTROSE 2-3 GM-%(50ML) IV SOLR
INTRAVENOUS | Status: DC | PRN
  Administered 2022-03-21: 2 g via INTRAVENOUS

## 2022-03-21 MED ORDER — OXYCODONE HCL 5 MG/5ML PO SOLN: 5.0000 mg | Freq: Once | ORAL | Status: DC | PRN

## 2022-03-21 MED ORDER — WITCH HAZEL-GLYCERIN EX PADS: 1.0000 | MEDICATED_PAD | CUTANEOUS | Status: DC | PRN

## 2022-03-21 MED ORDER — DEXAMETHASONE SODIUM PHOSPHATE 10 MG/ML IJ SOLN
INTRAMUSCULAR | Status: DC | PRN
  Administered 2022-03-21: 4 mg via INTRAVENOUS

## 2022-03-21 MED ORDER — LIDOCAINE-EPINEPHRINE (PF) 2 %-1:200000 IJ SOLN
INTRAMUSCULAR | Status: DC | PRN
  Administered 2022-03-21: 2 mL via EPIDURAL
  Administered 2022-03-21: 3 mL via EPIDURAL
  Administered 2022-03-21: 10 mL via EPIDURAL

## 2022-03-21 MED ORDER — OXYCODONE HCL 5 MG PO TABS: 5.0000 mg | ORAL_TABLET | Freq: Once | ORAL | Status: DC | PRN

## 2022-03-21 MED ORDER — CEFAZOLIN SODIUM-DEXTROSE 2-4 GM/100ML-% IV SOLN: 2.0000 g | INTRAVENOUS | Status: DC

## 2022-03-21 MED ORDER — ACETAMINOPHEN 10 MG/ML IV SOLN
INTRAVENOUS | Status: DC | PRN
  Administered 2022-03-21: 1000 mg via INTRAVENOUS

## 2022-03-21 MED ORDER — MORPHINE SULFATE (PF) 0.5 MG/ML IJ SOLN
INTRAMUSCULAR | Status: AC
  Filled 2022-03-21: qty 10

## 2022-03-21 MED ORDER — SODIUM CHLORIDE (PF) 0.9 % IJ SOLN
INTRAMUSCULAR | Status: AC
  Filled 2022-03-21: qty 10

## 2022-03-21 MED ORDER — OXYTOCIN-SODIUM CHLORIDE 30-0.9 UT/500ML-% IV SOLN
INTRAVENOUS | Status: DC | PRN
  Administered 2022-03-21: 500 mL/h via INTRAVENOUS
  Administered 2022-03-21: 250 mL/h via INTRAVENOUS

## 2022-03-21 MED ORDER — PROMETHAZINE HCL 25 MG/ML IJ SOLN: 6.2500 mg | INTRAMUSCULAR | Status: DC | PRN

## 2022-03-21 MED ORDER — KETOROLAC TROMETHAMINE 30 MG/ML IJ SOLN
30.0000 mg | Freq: Four times a day (QID) | INTRAMUSCULAR | Status: AC
  Administered 2022-03-21 (×3): 30 mg via INTRAVENOUS
  Filled 2022-03-21 (×3): qty 1

## 2022-03-21 MED ORDER — SODIUM CHLORIDE 0.9 % IV SOLN
500.0000 mg | INTRAVENOUS | Status: AC
  Administered 2022-03-21: 500 mg via INTRAVENOUS

## 2022-03-21 MED ORDER — OXYTOCIN-SODIUM CHLORIDE 30-0.9 UT/500ML-% IV SOLN: 2.5000 [IU]/h | INTRAVENOUS | Status: AC

## 2022-03-21 MED ORDER — PHENYLEPHRINE 80 MCG/ML (10ML) SYRINGE FOR IV PUSH (FOR BLOOD PRESSURE SUPPORT)
PREFILLED_SYRINGE | INTRAVENOUS | Status: AC
  Filled 2022-03-21: qty 10

## 2022-03-21 MED ORDER — MEPERIDINE HCL 25 MG/ML IJ SOLN
INTRAMUSCULAR | Status: AC
  Filled 2022-03-21: qty 1

## 2022-03-21 MED ORDER — COCONUT OIL OIL: 1.0000 | TOPICAL_OIL | Status: DC | PRN

## 2022-03-21 MED ORDER — ONDANSETRON HCL 4 MG/2ML IJ SOLN
INTRAMUSCULAR | Status: DC | PRN
  Administered 2022-03-21: 4 mg via INTRAVENOUS

## 2022-03-21 MED ORDER — IBUPROFEN 600 MG PO TABS
600.0000 mg | ORAL_TABLET | Freq: Four times a day (QID) | ORAL | Status: DC
Start: 2022-03-22 — End: 2022-03-24
  Administered 2022-03-21 – 2022-03-24 (×10): 600 mg via ORAL
  Filled 2022-03-21 (×11): qty 1

## 2022-03-21 MED ORDER — AMISULPRIDE (ANTIEMETIC) 5 MG/2ML IV SOLN: 10.0000 mg | Freq: Once | INTRAVENOUS | Status: DC | PRN

## 2022-03-21 MED ORDER — SIMETHICONE 80 MG PO CHEW
80.0000 mg | CHEWABLE_TABLET | ORAL | Status: DC | PRN
  Administered 2022-03-23: 80 mg via ORAL
  Filled 2022-03-21: qty 1

## 2022-03-21 MED ORDER — DIPHENHYDRAMINE HCL 50 MG/ML IJ SOLN
INTRAMUSCULAR | Status: AC
  Filled 2022-03-21: qty 1

## 2022-03-21 MED ORDER — ACETAMINOPHEN 10 MG/ML IV SOLN: 1000.0000 mg | Freq: Once | INTRAVENOUS | Status: DC | PRN

## 2022-03-21 MED ORDER — OXYCODONE HCL 5 MG PO TABS
5.0000 mg | ORAL_TABLET | ORAL | Status: DC | PRN
  Administered 2022-03-24: 5 mg via ORAL
  Filled 2022-03-21: qty 1

## 2022-03-21 MED ORDER — HYDROMORPHONE HCL 1 MG/ML IJ SOLN: 0.2000 mg | INTRAMUSCULAR | Status: DC | PRN

## 2022-03-21 MED ORDER — DIBUCAINE (PERIANAL) 1 % EX OINT
1.0000 | TOPICAL_OINTMENT | CUTANEOUS | Status: DC | PRN
Start: 2022-03-21 — End: 2022-03-24

## 2022-03-21 MED ORDER — MORPHINE SULFATE (PF) 0.5 MG/ML IJ SOLN
INTRAMUSCULAR | Status: DC | PRN
  Administered 2022-03-21: 2.5 mg via EPIDURAL

## 2022-03-21 SURGICAL SUPPLY — 37 items
BENZOIN TINCTURE PRP APPL 2/3 (GAUZE/BANDAGES/DRESSINGS) ×1 IMPLANT
CHLORAPREP W/TINT 26ML (MISCELLANEOUS) ×4 IMPLANT
CLAMP CORD UMBIL (MISCELLANEOUS) ×2 IMPLANT
CLOSURE STERI STRIP 1/2 X4 (GAUZE/BANDAGES/DRESSINGS) ×1 IMPLANT
CLOTH BEACON ORANGE TIMEOUT ST (SAFETY) ×2 IMPLANT
DERMABOND ADVANCED (GAUZE/BANDAGES/DRESSINGS)
DERMABOND ADVANCED .7 DNX12 (GAUZE/BANDAGES/DRESSINGS) IMPLANT
DRAPE C SECTION CLR SCREEN (DRAPES) ×2 IMPLANT
DRSG OPSITE POSTOP 4X10 (GAUZE/BANDAGES/DRESSINGS) ×2 IMPLANT
ELECT REM PT RETURN 9FT ADLT (ELECTROSURGICAL) ×2
ELECTRODE REM PT RTRN 9FT ADLT (ELECTROSURGICAL) ×1 IMPLANT
EXTRACTOR VACUUM M CUP 4 TUBE (SUCTIONS) IMPLANT
GLOVE BIOGEL PI IND STRL 7.0 (GLOVE) ×2 IMPLANT
GLOVE BIOGEL PI INDICATOR 7.0 (GLOVE) ×2
GLOVE SURG SS PI 6.5 STRL IVOR (GLOVE) ×2 IMPLANT
GOWN STRL REUS W/TWL LRG LVL3 (GOWN DISPOSABLE) ×4 IMPLANT
KIT ABG SYR 3ML LUER SLIP (SYRINGE) IMPLANT
NDL HYPO 25X5/8 SAFETYGLIDE (NEEDLE) IMPLANT
NEEDLE HYPO 25X5/8 SAFETYGLIDE (NEEDLE) IMPLANT
NS IRRIG 1000ML POUR BTL (IV SOLUTION) ×2 IMPLANT
PACK C SECTION WH (CUSTOM PROCEDURE TRAY) ×2 IMPLANT
PAD OB MATERNITY 4.3X12.25 (PERSONAL CARE ITEMS) ×2 IMPLANT
RTRCTR C-SECT PINK 25CM LRG (MISCELLANEOUS) ×2 IMPLANT
SUT CHROMIC 1 CTX 36 (SUTURE) IMPLANT
SUT CHROMIC 2 0 CT 1 (SUTURE) ×2 IMPLANT
SUT MON AB 4-0 PS1 27 (SUTURE) ×2 IMPLANT
SUT PLAIN 1 NONE 54 (SUTURE) IMPLANT
SUT PLAIN 2 0 (SUTURE)
SUT PLAIN 2 0 XLH (SUTURE) IMPLANT
SUT PLAIN ABS 2-0 CT1 27XMFL (SUTURE) IMPLANT
SUT VIC AB 0 CTX 36 (SUTURE) ×2
SUT VIC AB 0 CTX36XBRD ANBCTRL (SUTURE) ×1 IMPLANT
SUT VIC AB 1 CTX 36 (SUTURE) ×2
SUT VIC AB 1 CTX36XBRD ANBCTRL (SUTURE) ×2 IMPLANT
TOWEL OR 17X24 6PK STRL BLUE (TOWEL DISPOSABLE) ×2 IMPLANT
TRAY FOLEY W/BAG SLVR 14FR LF (SET/KITS/TRAYS/PACK) ×2 IMPLANT
WATER STERILE IRR 1000ML POUR (IV SOLUTION) ×2 IMPLANT

## 2022-03-21 NOTE — Progress Notes (Signed)
Lisa Calderon is a 21 y.o. G1P0 at [redacted]w[redacted]d admitted for induction of labor due to Poor fetal growth.  I was called to evaluated patient for abnormal fetal heart tracing on pitocin. Patient reports no complaints.    Objective: BP 131/67   Pulse (!) 101   Temp 98.4 F (36.9 C) (Oral)   Resp 18   Ht 5\' 4"  (1.626 m)   Wt 81.4 kg   LMP 07/02/2021   SpO2 100%   BMI 30.81 kg/m  I/O last 3 completed shifts: In: -  Out: 575 [Urine:575] Total I/O In: -  Out: 2525 [Urine:2525]  FHT:  FHR: 140 bpm, variability: moderate,  accelerations:  Present,  decelerations:  Present variables UC:   irregular, every 3 minutes SVE:   Dilation: 3 Effacement (%): 80 Station: -3, -2 Exam by:: Kyira Volkert MD  Labs: Lab Results  Component Value Date   WBC 13.6 (H) 03/20/2022   HGB 10.8 (L) 03/20/2022   HCT 32.3 (L) 03/20/2022   MCV 88.5 03/20/2022   PLT 266 03/20/2022    Assessment / Plan: 21 y/o G1P0 at 37 weeks 3 days EGA, IOL for IUGR with abnormal fetal heart tracing, category 2,  Labor:  Recommended cesarean delivery and patient agrees due to abnormal fetal heart tracing and remote from delivery. We discussed risks, benefits and alternatives of cesarean section including risks of bleeding, infection, damage to organs.  All her questions were answered and she expressed understanding.  Preeclampsia:   None Fetal Wellbeing:  Category II Pain Control:  Epidural I/D:  GBS positive Anticipated MOD:   Cesarean delivery.  20, MD 03/21/2022, 3:29 AM

## 2022-03-21 NOTE — Progress Notes (Signed)
Subjective: Postpartum Day 0: Cesarean Delivery Patient reports incisional pain and tolerating PO.  Foley still in place.. she denies headache visual disturbance or ruq pain.   Objective: Vital signs in last 24 hours: Temp:  [98.4 F (36.9 C)-99.5 F (37.5 C)] 98.9 F (37.2 C) (07/26 1130) Pulse Rate:  [65-124] 90 (07/26 1130) Resp:  [13-27] 20 (07/26 1130) BP: (104-157)/(57-101) 137/99 (07/26 1130) SpO2:  [98 %-100 %] 99 % (07/26 1130)  Physical Exam:  General: alert, cooperative, and no distress Lochia: appropriate Uterine Fundus: firm Incision: healing well DVT Evaluation: No evidence of DVT seen on physical exam.  Recent Labs    03/19/22 0740 03/20/22 1945  HGB 11.2* 10.8*  HCT 32.6* 32.3*    Assessment/Plan: Status post Cesarean section. Doing well postoperatively.  Preeclampsia without severe features- pt started on Procardia XL 30 mg daily for bp controll.  Routine postoperative care.  Dr Connye Burkitt covering starting 7 am on 03/22/2022.  Gerald Leitz, MD 03/21/2022, 1:20 PM

## 2022-03-21 NOTE — Interval H&P Note (Signed)
History and Physical Interval Note:  03/21/2022 3:36 AM  Lisa Calderon  has presented today for surgery, with the diagnosis of Unscheduled Cesarean Section, Abnormal fetal heart tracing. See Delivery Summary.  The various methods of treatment have been discussed with the patient and family. After consideration of risks, benefits and other options for treatment, the patient has consented to  Procedure(s): CESAREAN SECTION (N/A) as a surgical intervention.  The patient's history has been reviewed, patient examined, no change in status, stable for surgery.  I have reviewed the patient's chart and labs.  Questions were answered to the patient's satisfaction.     Prescilla Sours, MD.

## 2022-03-21 NOTE — Lactation Note (Signed)
This note was copied from a baby's chart. Lactation Consultation Note  Patient Name: Lisa Calderon MHWKG'S Date: 03/21/2022 Reason for consult: Initial assessment;Early term 37-38.6wks;Primapara;1st time breastfeeding;Infant < 6lbs;Other (Comment) (Baby is IUGR) Age:22 hours   P1 - Early term infant (IUGR) Baby < 5 lbs Feeding preference - Breast/formula  Baby "Cyari" was asleep STS on birth parent's chest when I arrived.  RN entered the room to provide glucose gel for a blood sugar reading of 30 mg/dl.  "Cyari" has been consuming appropriate amounts of formula.    Reviewed the LPTI guidelines with family.  Stressed the importance of feeding every 2-3 hours until the blood sugars are stable.  Suggested birth parent breast feed first, but only if "Cyari" acts interested and to spend no more than 5-10 minutes trying to latch.  Stressed the importance of getting calories from expressed colostrum and formula rather than spending a lot of time and energy on breast feeding at this time.  RN verified that "Georjean Mode" has been latching some.    Birth parent has been supplementing with Similac 22 calorie formula using the purple extra slow flow nipple.  Birth parent was interested in trying another nipple and I provided the Nfant white nipple.  Asked family to alert RN if there is any difficulty with feeding "Cyari."  Family verbalized understanding.  Birth parent is interested in beginning to pump with the electric pump, however, she desires to wait until the next feeding.  She is very sleepy.  Reminded her to allow another family member to do STS if she needs to sleep.  Birth parent has Media planner and also plans to apply for [facility].  Discussed the Stork pump option and mother interested.  Spoke with RN; RN to follow through with ordering.  I faxed a referral to [facility], [facility], [facility] to obtain a hospital grade pump if eligible.  Family appreciative.  Suggested birth parent call her RN/LC for any  questions/concerns.     Maternal Data Has patient been taught Hand Expression?: Yes Does the patient have breastfeeding experience prior to this delivery?: No  Feeding Mother's Current Feeding Choice: Breast Milk and Formula Nipple Type: Slow - flow  LATCH Score                    Lactation Tools Discussed/Used    Interventions Interventions: Breast feeding basics reviewed;Education;LC Services brochure  Discharge Pump: WIC Loaner;Stork Pump (Mother desires an electric pump; [employer]ork form filled out and [facility] referral faxed) WIC Program: No (Plans to apply; encouraged to call today)  Consult Status Consult Status: Follow-up Date: 03/22/22 Follow-up type: In-patient    Jacqualin Shirkey R Tony Granquist 03/21/2022, 10:38 AM

## 2022-03-21 NOTE — Transfer of Care (Signed)
Immediate Anesthesia Transfer of Care Note  Patient: Lisa Calderon  Procedure(s) Performed: CESAREAN SECTION  Patient Location: PACU  Anesthesia Type:Regional and Epidural  Level of Consciousness: awake, alert , oriented and patient cooperative  Airway & Oxygen Therapy: Patient Spontanous Breathing  Post-op Assessment: Report given to RN and Post -op Vital signs reviewed and stable  Post vital signs: Reviewed and stable  Last Vitals:  Vitals Value Taken Time  BP 133/78 03/21/22 0515  Temp    Pulse 107 03/21/22 0519  Resp 16 03/21/22 0519  SpO2 100 % 03/21/22 0519  Vitals shown include unvalidated device data.  Last Pain:  Vitals:   03/21/22 0233  TempSrc:   PainSc: Asleep         Complications: No notable events documented.

## 2022-03-21 NOTE — Op Note (Signed)
Patient: Lisa Calderon DOB: 05-31-2001 MRN:  [id number]  DATE OF SURGERY: 03/21/2022  PREOP DIAGNOSIS:  1. 37 week 3 day EGA intrauterine pregnancy. 2.  Induction of labor for severe intrauterine growth restriction, last growth ultrasound showed IUGR less than 1st%.  3. Abnormal fetal heart tracing with recurrent variable decelerations and prolonged decelerations.    4. Remote from delivery at 3 cm dilation.   POSTOP DIAGNOSIS: Same as above.  PROCEDURE: Primary low uterine segment transverse cesarean section via Pfannenstiel incision.     SURGEON: Dr.  Hoover Browns.  ASSISTANT: Ob Fellow-  Dr. Evalina Field.  SURGEON ATTESTATION: I was present and scrubbed and performed the procedure and the assistant was required due to the complexity of anatomy.   ANESTHESIA: Bolused epidural.  COMPLICATIONS: None.  FINDINGS: Viable female infant with compound presentation of hand and head, DOA position.Weight 4 lbs 7.2 oz, Apgar scores of 6 and 8. Normal uterus and fallopian tubes and ovaries bilaterally.    EBL:  733 cc.  IV FLUID:  1500 cc LR.  URINE OUTPUT: 300 cc clear urine.  INDICATIONS: 21 y/o P0 who presented for induction of labor for IUGR. She received cytotec, foley balloon and pitocin for induction. She progressed to 3 cm dilation and started having recurrent variables and prolonged decelerations on 1 milliunit of pitocin. Decelerations persisted despite intrauterine resuscitation.  A cesarean section was recommended for abnormal fetal heart tracing and remote from delivery.  She was consented for the procedure after explaining risks benefits and alternatives of the procedure including but not limited to heavy bleeding, infection, damage to organs.    PROCEDURE:   She was taken to the operating room where her epidural anesthesia was found to be adequate. She was prepped and draped in the usual sterile fashion and a Foley catheter was placed. She received 2 g of IV Ancef and IV  Azithromycin preoperatively. A Pfannenstiel incision was made with the scalpel and the incision extended through the subcutaneous layer and also the fascia with the bovie. Small perforators in the subcutaneous layer were contained with the Bovie. The fascia was nicked in the midline and then was further separated from the rectus muscles bilaterally using Mayo scissors. Kochers were placed inferiorly and then superiorly to allow further separation of fascia from the rectus muscles.  The peritoneal cavity was entered bluntly with the fingers. The Alexis retractor was placed in. The vesicouterine fold was dissected using Metzenbaum scissors.   The uterus was incised with a scalpel and the incision extended bluntly bilaterally with fingers and bandage scissors. Copious clear amnio infusion fluid was noted.  Baby's hand was encountered upon uterine entry.  This was pushed back in then head was flexed and delivered thorough the incision, compound presentation noted with the hand and head at delivery.  Then the rest of the body was delivered atraumatically.  She delivered a viable female infant, apgar scores 6, 8.  The cord was clamped and cut after 1 minute. Cord blood was collected.    The uterus was not exteriorized.  The edges of the uterus was grasped with clamps.  The placenta was delivered with gentle traction on the umbilical cord.  The uterus was cleared of clots and debris with a lap.  The  uterine incision was closed with #1 Vicryl in a running locked stitch. An imbricating layer of same stitch was placed over the initial closure.  Excellent hemostasis was noted over the incision.  Adnexa was explored and noted as above.  The peritoneum were then reapproximated using chromic suture in a running stitch.  Fascia was closed bilaterally using 0 Vicryl in a running stitch. The subcutaneous layer was irrigated and suctioned out. Small perforators were contained with the bovie.  The subcutaneous was closed over  using 1-0 plain in interrupted stitches. The skin was closed using 4-0 Monocryl. Benzocaine, steri strips and Honeycomb dressings were applied.  The patient was then cleaned and she was taken to the recovery room with her baby in stable conditions.   SPECIMEN: Umbilical cord blood  DISPOSITION: TO PACU, STABLE.   Hoover Browns, MD. 03/21/2022.

## 2022-03-21 NOTE — H&P (View-Only) (Signed)
Lisa Calderon is a 21 y.o. G1P0 at [redacted]w[redacted]d admitted for induction of labor due to Poor fetal growth.  I was called to evaluated patient for abnormal fetal heart tracing on pitocin. Patient reports no complaints.    Objective: BP 131/67   Pulse (!) 101   Temp 98.4 F (36.9 C) (Oral)   Resp 18   Ht 5' 4" (1.626 m)   Wt 81.4 kg   LMP 07/02/2021   SpO2 100%   BMI 30.81 kg/m  I/O last 3 completed shifts: In: -  Out: 575 [Urine:575] Total I/O In: -  Out: 2525 [Urine:2525]  FHT:  FHR: 140 bpm, variability: moderate,  accelerations:  Present,  decelerations:  Present variables UC:   irregular, every 3 minutes SVE:   Dilation: 3 Effacement (%): 80 Station: -3, -2 Exam by:: Johnnye Sandford MD  Labs: Lab Results  Component Value Date   WBC 13.6 (H) 03/20/2022   HGB 10.8 (L) 03/20/2022   HCT 32.3 (L) 03/20/2022   MCV 88.5 03/20/2022   PLT 266 03/20/2022    Assessment / Plan: 21 y/o G1P0 at 37 weeks 3 days EGA, IOL for IUGR with abnormal fetal heart tracing, category 2,  Labor:  Recommended cesarean delivery and patient agrees due to abnormal fetal heart tracing and remote from delivery. We discussed risks, benefits and alternatives of cesarean section including risks of bleeding, infection, damage to organs.  All her questions were answered and she expressed understanding.  Preeclampsia:   None Fetal Wellbeing:  Category II Pain Control:  Epidural I/D:  GBS positive Anticipated MOD:   Cesarean delivery.  Danee Soller W Wilson Dusenbery, MD 03/21/2022, 3:29 AM   

## 2022-03-22 LAB — CBC
HCT: 26.2 % — ABNORMAL LOW (ref 36.0–46.0)
Hemoglobin: 8.7 g/dL — ABNORMAL LOW (ref 12.0–15.0)
MCH: 29.6 pg (ref 26.0–34.0)
MCHC: 33.2 g/dL (ref 30.0–36.0)
MCV: 89.1 fL (ref 80.0–100.0)
Platelets: 248 10*3/uL (ref 150–400)
RBC: 2.94 MIL/uL — ABNORMAL LOW (ref 3.87–5.11)
RDW: 12.8 % (ref 11.5–15.5)
WBC: 16.1 10*3/uL — ABNORMAL HIGH (ref 4.0–10.5)
nRBC: 0 % (ref 0.0–0.2)

## 2022-03-22 LAB — SURGICAL PATHOLOGY

## 2022-03-22 MED ORDER — ACETAMINOPHEN 500 MG PO TABS: 1000.0000 mg | ORAL_TABLET | Freq: Four times a day (QID) | ORAL | Status: DC

## 2022-03-22 MED ORDER — FERROUS SULFATE 325 (65 FE) MG PO TABS
325.0000 mg | ORAL_TABLET | Freq: Two times a day (BID) | ORAL | Status: DC
  Administered 2022-03-22 – 2022-03-24 (×5): 325 mg via ORAL
  Filled 2022-03-22 (×5): qty 1

## 2022-03-22 MED ORDER — DIPHENHYDRAMINE HCL 50 MG/ML IJ SOLN: 12.5000 mg | INTRAMUSCULAR | Status: DC | PRN

## 2022-03-22 MED ORDER — NALOXONE HCL 4 MG/10ML IJ SOLN: 1.0000 ug/kg/h | INTRAVENOUS | Status: DC | PRN

## 2022-03-22 MED ORDER — SODIUM CHLORIDE 0.9% FLUSH: 3.0000 mL | INTRAVENOUS | Status: DC | PRN

## 2022-03-22 MED ORDER — ONDANSETRON HCL 4 MG/2ML IJ SOLN: 4.0000 mg | Freq: Three times a day (TID) | INTRAMUSCULAR | Status: DC | PRN

## 2022-03-22 MED ORDER — DIPHENHYDRAMINE HCL 25 MG PO CAPS: 25.0000 mg | ORAL_CAPSULE | ORAL | Status: DC | PRN

## 2022-03-22 MED ORDER — KETOROLAC TROMETHAMINE 30 MG/ML IJ SOLN: 30.0000 mg | Freq: Four times a day (QID) | INTRAMUSCULAR | Status: AC | PRN

## 2022-03-22 MED ORDER — NALOXONE HCL 0.4 MG/ML IJ SOLN: 0.4000 mg | INTRAMUSCULAR | Status: DC | PRN

## 2022-03-22 NOTE — Progress Notes (Signed)
Postpartum Note Day #1  S:  Patient doing well.  Pain controlled.  Tolerating regular diet.   Ambulating and voiding without difficulty.   Denies fevers, chills, chest pain, SOB, N/V, or worsening bilateral LE edema.  Lochia: Minimal Infant feeding:  Breast and bottle Circumcision:  N/A, female infant Contraception:  To be addressed at PPV  O: Temp:  [98.2 F (36.8 C)-99.5 F (37.5 C)] 98.2 F (36.8 C) (07/27 0511) Pulse Rate:  [73-102] 79 (07/27 0511) Resp:  [18-20] 19 (07/27 0511) BP: (107-137)/(47-99) 107/47 (07/27 0511) SpO2:  [98 %-100 %] 99 % (07/27 0511) Gen: NAD, pleasant and cooperative Resp: No increased work of breathing Abdomen: soft, non-distended, non-tender throughout Uterus: firm, non-tender, below umbilicus Incision: c/d/i, honeycomb dressing in place  Ext: No bilateral LE edema, no bilateral calf tenderness  Labs:  Recent Labs    03/20/22 1945 03/22/22 0406  HGB 10.8* 8.7*  HCT 32.3* 26.2*    A/P: Patient is a 21 y.o. G1P1001 POD#1 s/p LTCS.  S/p LTCS - Pain well controlled  - GU: UOP is adequate - GI: Tolerating regular diet - Activity: encouraged sitting up to chair and ambulation as tolerated - DVT Prophylaxis: Ambulation - Labs: as above - oral iron ordered  Preeclampsia without severe features - Medication: Procardia XL 30mg  daily - Preeclampsia labs unremarkable, PCR 1.60 - Blood pressures normotensive since initiation of BP medication  Disposition:  D/C home likely POD#3   , DO [phone] (office)

## 2022-03-22 NOTE — Anesthesia Postprocedure Evaluation (Signed)
Anesthesia Post Note  Patient: Education officer, museum  Procedure(s) Performed: CESAREAN SECTION     Patient location during evaluation: Mother Baby Anesthesia Type: Epidural Level of consciousness: awake and alert Pain management: pain level controlled Vital Signs Assessment: post-procedure vital signs reviewed and stable Respiratory status: spontaneous breathing, nonlabored ventilation and respiratory function stable Cardiovascular status: stable Postop Assessment: no headache, no backache and epidural receding Anesthetic complications: no   No notable events documented.  Last Vitals:  Vitals:   03/22/22 1000 03/22/22 1148  BP: 133/89 133/86  Pulse:    Resp:    Temp:    SpO2:      Last Pain:  Vitals:   03/22/22 0950  TempSrc:   PainSc: 4    Pain Goal:                   Mellody Dance

## 2022-03-23 DIAGNOSIS — O1493 Unspecified pre-eclampsia, third trimester: Secondary | ICD-10-CM | POA: Diagnosis present

## 2022-03-23 MED ORDER — FERROUS SULFATE 325 (65 FE) MG PO TABS: 325.0000 mg | ORAL_TABLET | Freq: Every day | ORAL | 1 refills | Status: DC

## 2022-03-23 MED ORDER — IBUPROFEN 600 MG PO TABS: 600.0000 mg | ORAL_TABLET | Freq: Four times a day (QID) | ORAL | 1 refills | Status: DC | PRN

## 2022-03-23 MED ORDER — OXYCODONE HCL 5 MG PO TABS
5.0000 mg | ORAL_TABLET | Freq: Four times a day (QID) | ORAL | 0 refills | Status: DC | PRN
Start: 2022-03-23 — End: 2022-11-17

## 2022-03-23 MED ORDER — NIFEDIPINE ER 30 MG PO TB24: 30.0000 mg | ORAL_TABLET | Freq: Every day | ORAL | 1 refills | Status: DC

## 2022-03-23 MED ORDER — ACETAMINOPHEN 500 MG PO TABS: 1000.0000 mg | ORAL_TABLET | Freq: Three times a day (TID) | ORAL | 0 refills | Status: DC | PRN

## 2022-03-23 NOTE — Lactation Note (Signed)
This note was copied from a baby's chart. Lactation Consultation Note  Patient Name: Lisa Calderon Date: 03/23/2022 Reason for consult: Follow-up assessment;Primapara;1st time breastfeeding;Infant < 6lbs;Early term 37-38.6wks IUGR Age:22 hours  LC in to visit with P1 Mom of ET infant weighing 4 lbs 4.3 oz today, a weight loss of 4.1% from birth weight.  Baby has been feeding formula 22 cal by bottle with an extra slow flow nipple changed to Nfant slow flow nipple.    Mom states she wanted to start pumping the first day and on day 2 her RN set up the pump and showed her how to use it, but she didn't pump until today, 54 hrs post partum.  Mom expressed 5 ml of colostrum which was bottle fed to baby.    Currently waiting on SLP to do her evaluation.    Plan recommended- 1- Keep baby STS as much as possible 2- Every 3 hrs or sooner if baby is acting hungry, offer the breast or go straight to paced bottle feeding of EBM+/formula.  Volume per LPTI guidelines 20-30 ml today. 3- Pump every 2-3 hrs on initiation setting for 15 mins. 4- ask LC/RN for assistance prn  Reviewed importance of disassembling pump parts, washing, rinsing and air drying in separate bin provided.  Mom did get a call from [facility], plans to return the call.  Mom aware of [facility] loaner program if discharged over the weekend when [facility] is closed.  Mom has a hand's free pump at home.  LC recommended she use the Medela Symphony to better support her milk supply.     Lactation Tools Discussed/Used Tools: Pump;Flanges;Bottle Flange Size: 24 Breast pump type: Double-Electric Breast Pump;Manual Pump Education: Setup, frequency, and cleaning;Milk Storage Reason for Pumping: Support milk supply/ET infant weighing <5lbs Pumping frequency: Mom assisted to pump for first time. Pumped volume: 5 mL  Interventions Interventions: Breast feeding basics reviewed;Skin to skin;Breast massage;Hand express;DEBP  Discharge WIC  Program: Yes  Consult Status Consult Status: Follow-up Date: 03/24/22 Follow-up type: In-patient    Lisa Calderon 03/23/2022, 11:44 AM

## 2022-03-23 NOTE — Progress Notes (Signed)
Patient has no complaints or concerns at this time.  

## 2022-03-23 NOTE — Progress Notes (Signed)
Subjective: Postpartum Day 2: Cesarean Delivery Patient reports tolerating PO, + flatus, + BM, and no problems voiding. She denies headache visual diturbances or ruq pain.  Pain is well controlled   Objective: Vital signs in last 24 hours: Temp:  [97.9 F (36.6 C)-98.7 F (37.1 C)] 97.9 F (36.6 C) (07/28 0553) Pulse Rate:  [85-100] 85 (07/28 0553) Resp:  [17-18] 18 (07/28 0553) BP: (115-133)/(68-89) 115/68 (07/28 0553) SpO2:  [98 %-100 %] 100 % (07/28 0553)  Physical Exam:  General: alert, cooperative, and no distress Lochia: appropriate Uterine Fundus: firm Incision: healing well DVT Evaluation: No evidence of DVT seen on physical exam.  Recent Labs    03/20/22 1945 03/22/22 0406  HGB 10.8* 8.7*  HCT 32.3* 26.2*    Assessment/Plan: Status post Cesarean section. Doing well postoperatively.  Preeclampsia without severe features.. continue procardia xl 30 mg daily   Plan for discharge home tomorrow  Bp check in the office in 1 week  Incision check in the office in 2 weeks .  Gerald Leitz, MD 03/23/2022, 8:14 AM

## 2022-03-24 DIAGNOSIS — D62 Acute posthemorrhagic anemia: Secondary | ICD-10-CM | POA: Diagnosis not present

## 2022-03-24 DIAGNOSIS — O368390 Maternal care for abnormalities of the fetal heart rate or rhythm, unspecified trimester, not applicable or unspecified: Secondary | ICD-10-CM | POA: Diagnosis not present

## 2022-03-24 DIAGNOSIS — Z98891 History of uterine scar from previous surgery: Secondary | ICD-10-CM

## 2022-03-24 NOTE — Discharge Summary (Signed)
PCS OB Discharge Summary     Patient Name: Lisa Calderon DOB: [birth year] MRN: [id number]  Date of admission: 03/19/2022 Delivering MD: Hoover Browns  Date of delivery: 03/21/2022 Type of delivery: PCS  Newborn Data: Sex: Baby female Live born female  Birth Weight: 4 lb 7.3 oz (2020 g) APGAR: 6, 8  Newborn Delivery   Birth date/time: 03/21/2022 04:08:00 Delivery type: C-Section, Low Transverse Trial of labor: Yes C-section categorization: Primary      Feeding: breast and bottle Infant being discharge to home with mother in stable condition.   Admitting diagnosis: IUGR (intrauterine growth restriction) affecting care of mother [O36.5990] Intrauterine pregnancy: [redacted]w[redacted]d     Secondary diagnosis:  Principal Problem:   IUGR (intrauterine growth restriction) affecting care of mother Active Problems:   Postpartum care following cesarean delivery   Hypertension in pregnancy, preeclampsia, third trimester   Acute blood loss anemia   S/P primary low transverse C-section   Fetal heart rate decelerations affecting management of mother                                Complications: None                                                              Intrapartum Procedures: cesarean: low cervical, transverse Postpartum Procedures: none Complications-Operative and Postpartum: none Augmentation: AROM, Pitocin, and Cytotec   History of Present Illness: Ms. Edye Hainline is a 21 y.o. female, G1P1001, who presents at [redacted]w[redacted]d weeks gestation. The patient has been followed at  [facility] and Gynecology  Her pregnancy has been complicated by:  Patient Active Problem List   Diagnosis Date Noted   Acute blood loss anemia 03/24/2022   S/P primary low transverse C-section 03/24/2022   Fetal heart rate decelerations affecting management of mother 03/24/2022   Postpartum care following cesarean delivery 03/23/2022   Hypertension in pregnancy, preeclampsia, third trimester 03/23/2022   IUGR  (intrauterine growth restriction) affecting care of mother 03/19/2022   Hyperthyroidism during pregnancy 08/18/2021     Active Ambulatory Problems    Diagnosis Date Noted   Hyperthyroidism during pregnancy 08/18/2021   Resolved Ambulatory Problems    Diagnosis Date Noted   No Resolved Ambulatory Problems   Past Medical History:  Diagnosis Date   Hyperthyroidism    Ovarian cyst      Hospital course:  Induction of Labor With Vaginal Delivery   21 y.o. yo G1P1001 at [redacted]w[redacted]d was admitted to the hospital 03/19/2022 for induction of labor.  Indication for induction:  IUGR .  Patient had an uncomplicated labor course as follows: Membrane Rupture Time/Date: 4:08 AM ,03/21/2022   Delivery Method:C-Section, Low Transverse  Episiotomy: None  Lacerations:  None  Details of delivery can be found in separate delivery note.  Patient had a routine postpartum course. Patient is discharged home 03/24/22.  Newborn Data: Birth date:03/21/2022  Birth time:4:08 AM  Gender:Female  Living status:Living  Apgars:6 ,8  Weight:2020 g  Postpartum Post-OP Day # 3 :  Hospital Course--Scheduled Cesarean: Patient was admitted on 7/26 for IOL at 37.3 weeks for IUGR per MFM, pt progressed with cytotec, pitocin and AROM, then had NRFHT. Pt also has hyperthyroidism no meds, will follow endocrinology  PP, and developed preE w/o SF during her stay, PCR was 1.6, other labs unremarkable, BP 136/77 now on 30mg  xl procardia, pt will go home on and f/u with [facility] for BP check in one week. A scheduled primary cesarean delivery was schedule R/T NRFHT.   She was taken to the operating room, where Dr. performed a primary LTCS under spinal anesthesia, with delivery of a viable baby female, with weight and Apgars as listed below. Infant was in good condition and remained at the patient's bedside.  The patient was taken to recovery in good condition.  Patient planned to breat and bottle feed.  On post-op day 1, patient was doing  well, tolerating a regular diet, with Hgb of 10.8-8.7 with QBLk of Sallye Ober.  Throughout her stay, her physical exam was WNL, her incision was CDI, and her vital signs remained stable.  By post-op day 1, she was up ad lib, tolerating a regular diet, with good pain control with po med.  She was deemed to have received the full benefit of her hospital stay, and was discharged home in stable condition.  Contraceptive choice was undecided.    Physical exam  Vitals:   03/23/22 1531 03/23/22 1739 03/23/22 2053 03/24/22 0500  BP: 131/86 130/80 136/88 136/77  Pulse: 85 85 82 85  Resp: 18  18 18   Temp: 98.3 F (36.8 C)   98.7 F (37.1 C)  TempSrc: Oral   Oral  SpO2: 99%     Weight:      Height:       General: alert, cooperative, and no distress Lochia: appropriate Uterine Fundus: firm Incision: Healing well with no significant drainage, No significant erythema, Dressing is clean, dry, and intact, honeycomb dressing CDI Perineum: intact DVT Evaluation: No evidence of DVT seen on physical exam. Negative Homan's sign. No cords or calf tenderness. No significant calf/ankle edema.  Labs: Lab Results  Component Value Date   WBC 16.1 (H) 03/22/2022   HGB 8.7 (L) 03/22/2022   HCT 26.2 (L) 03/22/2022   MCV 89.1 03/22/2022   PLT 248 03/22/2022      Latest Ref Rng & Units 03/20/2022    7:45 PM  CMP  Glucose 70 - 99 mg/dL 72   BUN 6 - 20 mg/dL <5   Creatinine 03/24/2022 - 1.00 mg/dL 03/22/2022   Sodium 2.44 - 0.10 mmol/L 138   Potassium 3.5 - 5.1 mmol/L 3.8   Chloride 98 - 111 mmol/L 107   CO2 22 - 32 mmol/L 22   Calcium 8.9 - 10.3 mg/dL 8.7   Total Protein 6.5 - 8.1 g/dL 6.5   Total Bilirubin 0.3 - 1.2 mg/dL 0.3   Alkaline Phos 38 - 126 U/L 87   AST 15 - 41 U/L 18   ALT 0 - 44 U/L 11     Date of discharge: 03/24/2022 Discharge Diagnoses: Term Pregnancy-delivered, Preelampsia, and hyperthyroidism  Discharge instruction: per After Visit Summary and "Baby and Me Booklet".  After visit meds:    Activity:           unrestricted and pelvic rest Advance as tolerated. Pelvic rest for 6 weeks.  Diet:                routine Medications: PNV, Ibuprofen, Colace, Iron, and procarida, oxy ir  Postpartum contraception: Undecided Condition:  Pt discharge to home with baby in stable condition Anemia: PO Iron PreE: continue procardia 30mg  xl, BP check in 1 week.   Meds: Allergies as  of 03/24/2022   No Known Allergies      Medication List     TAKE these medications    acetaminophen 500 MG tablet Commonly known as: TYLENOL Take 2 tablets (1,000 mg total) by mouth every 8 (eight) hours as needed.   ferrous sulfate 325 (65 FE) MG tablet Take 1 tablet (325 mg total) by mouth daily with breakfast.   ibuprofen 600 MG tablet Commonly known as: ADVIL Take 1 tablet (600 mg total) by mouth every 6 (six) hours as needed.   NIFEdipine 30 MG 24 hr tablet Commonly known as: ADALAT CC Take 1 tablet (30 mg total) by mouth daily.   oxyCODONE 5 MG immediate release tablet Commonly known as: Oxy IR/ROXICODONE Take 1-2 tablets (5-10 mg total) by mouth every 6 (six) hours as needed for severe pain.   Prenatal Vitamin 27-0.8 MG Tabs Take 1 tablet by mouth daily.               Discharge Care Instructions  (From admission, onward)           Start     Ordered   03/24/22 0000  Discharge wound care:       Comments: Take dressing off on day 5-7 postpartum.  Report increased drainage, redness or warmth. Clean with water, let soap trickle down body. Can leave steri strips on until they fall off or take them off gently at day 10. Keep open to air, clean and dry.   03/24/22 0843            Discharge Follow Up:   Follow-up Information     Gerald Leitz, MD Follow up in 1 week(s).   Specialty: Obstetrics and Gynecology Why: Make an appointment with Dr. Richardson Dopp at [facility] to have your blood pressure checked between Wednesday and Friday of next week. Return to [facility] in 6 weeks for  your regular postpartum visit. Call [facility] if you have questions. Contact information: [address] [place] [address] [phone]                  [place], NP-C, CNM 03/24/2022, 8:43 AM  [place], FNP

## 2022-03-24 NOTE — Lactation Note (Signed)
This note was copied from a baby's chart. Lactation Consultation Note  Patient Name: Girl Maisy Newport IRWER'X Date: 03/24/2022 Reason for consult: Follow-up assessment;Early term 37-38.6wks;Primapara;1st time breastfeeding;Infant < 6lbs;Mother's request Age:22 days  P1: Early term infant at 37+3 weeks Feeding preference: Breast/formula  Baby "Cyari" was being held by her grandmother when I arrived.  Birth parent has been providing 22 calorie formula using the Nfant extra slow flow nipple.  Birth parent reports that baby has been doing better with feeding.  She has worked with SLP and has a feeding plan in place.    Encouraged to continue pumping at least every three hours after feeding baby.  Mother plans to pick up her [facility] pump this next week.  She would like a [facility] loaner today since the office will not be open until Monday. Pump information given, form filled out and money collected.  Pump #[id number] given with instructions to take pump pieces with her.  Family with no further questions.  Money taken to MAU by LC.  Family has our OP phone number for any further questions/concerns.  2 visitors present and supportive.   Maternal Data    Feeding Mother's Current Feeding Choice: Breast Milk and Formula Nipple Type: Nfant Extra Slow Flow (gold)  LATCH Score                    Lactation Tools Discussed/Used    Interventions Interventions: Education  Discharge Discharge Education: Engorgement and breast care Pump: WIC Loaner (Provided [facility] loaner)  Consult Status Consult Status: Complete Date: 03/24/22 Follow-up type: Call as needed    Everardo Voris R Shahara Hartsfield 03/24/2022, 12:59 PM

## 2022-03-31 ENCOUNTER — Telehealth (HOSPITAL_COMMUNITY): Payer: Self-pay | Admitting: *Deleted

## 2022-03-31 NOTE — Telephone Encounter (Signed)
Attempted hospital discharge follow-up call. Left message for patient to return RN call with any questions or concerns. Deforest Hoyles, RN, 03/31/22, [phone]

## 2022-11-17 ENCOUNTER — Ambulatory Visit (HOSPITAL_COMMUNITY)
Admission: EM | Admit: 2022-11-17 | Discharge: 2022-11-17 | Disposition: A | Discharge status: Home / Self Care | Payer: Medicaid Other | Attending: Emergency Medicine | Admitting: Emergency Medicine

## 2022-11-17 ENCOUNTER — Encounter (HOSPITAL_COMMUNITY): Payer: Self-pay

## 2022-11-17 DIAGNOSIS — K1379 Other lesions of oral mucosa: Secondary | ICD-10-CM | POA: Diagnosis present

## 2022-11-17 LAB — POCT RAPID STREP A, ED / UC: Streptococcus, Group A Screen (Direct): NEGATIVE

## 2022-11-17 MED ORDER — LIDOCAINE VISCOUS HCL 2 % MT SOLN: 15.0000 mL | OROMUCOSAL | 0 refills | Status: AC | PRN

## 2022-11-17 MED ORDER — IBUPROFEN 800 MG PO TABS: 800.0000 mg | ORAL_TABLET | Freq: Three times a day (TID) | ORAL | 0 refills | Status: AC

## 2022-11-17 NOTE — ED Triage Notes (Signed)
Pt is here for dental pain x 1day. Pt taken asprin for pain but, it did not help

## 2022-11-17 NOTE — Discharge Instructions (Addendum)
Strep test is negative.   Ibuprofen and tylenol for pain  You can swish the lidocaine around in the mouth the help with gum pain  Please follow up with dentist

## 2022-11-17 NOTE — ED Provider Notes (Signed)
[place]    CSN: [id number] Arrival date & time: 11/17/22  1616     History   Chief Complaint Chief Complaint  Patient presents with   Dental Pain    HPI Lisa Calderon is a 22 y.o. female.  Yesterday started having a little pain in the gums.  Bottom left of mouth.  Denies dental pain or swelling.  She feels a little pain with swallowing on the right side of her throat as well. Took Tylenol last night that improved symptoms temporarily. No fevers Does not have a dentist  Past Medical History:  Diagnosis Date   Hyperthyroidism    Ovarian cyst     Patient Active Problem List   Diagnosis Date Noted   Acute blood loss anemia 03/24/2022   S/P primary low transverse C-section 03/24/2022   Fetal heart rate decelerations affecting management of mother 03/24/2022   Postpartum care following cesarean delivery 03/23/2022   Hypertension in pregnancy, preeclampsia, third trimester 03/23/2022   IUGR (intrauterine growth restriction) affecting care of mother 03/19/2022   Hyperthyroidism during pregnancy 08/18/2021    Past Surgical History:  Procedure Laterality Date   CESAREAN SECTION N/A 03/21/2022   Procedure: CESAREAN SECTION;  Surgeon: Waymon Amato, MD;  Location: [facility];  Service: Obstetrics;  Laterality: N/A;   NO PAST SURGERIES      OB History     Gravida  1   Para  1   Term  1   Preterm      AB      Living  1      SAB      IAB      Ectopic      Multiple  0   Live Births  1            Home Medications    Prior to Admission medications   Medication Sig Start Date End Date Taking? Authorizing Provider  ibuprofen (ADVIL) 800 MG tablet Take 1 tablet (800 mg total) by mouth 3 (three) times daily. 11/17/22  Yes Brittany Amirault, PA-C  lidocaine (XYLOCAINE) 2 % solution Use as directed 15 mLs in the mouth or throat every 3 (three) hours as needed for mouth pain. 11/17/22  Yes Taejah Ohalloran, Wells Guiles, PA-C    Family History Family History   Problem Relation Age of Onset   Asthma Neg Hx    Cancer Neg Hx    Diabetes Neg Hx    Heart disease Neg Hx    Hypertension Neg Hx    Kidney disease Neg Hx    Stroke Neg Hx     Social History Social History   Tobacco Use   Smoking status: Never    Passive exposure: Yes  Vaping Use   Vaping Use: Never used  Substance Use Topics   Alcohol use: Not Currently   Drug use: Never     Allergies   Patient has no known allergies.   Review of Systems Review of Systems As per HPI  Physical Exam Triage Vital Signs ED Triage Vitals  Enc Vitals Group     BP 11/17/22 1716 125/78     Pulse Rate 11/17/22 1716 84     Resp 11/17/22 1716 12     Temp 11/17/22 1716 98.4 F (36.9 C)     Temp Source 11/17/22 1716 Oral     SpO2 11/17/22 1716 98 %     Weight --      Height --      Head  Circumference --      Peak Flow --      Pain Score 11/17/22 1714 7     Pain Loc --      Pain Edu? --      Excl. in [place]? --    No data found.  Updated Vital Signs BP 125/78 (BP Location: Right Arm)   Pulse 84   Temp 98.4 F (36.9 C) (Oral)   Resp 12   LMP 10/26/2022   SpO2 98%   Physical Exam Vitals and nursing note reviewed.  Constitutional:      General: She is not in acute distress. HENT:     Nose: No rhinorrhea.     Mouth/Throat:     Mouth: Mucous membranes are moist.     Dentition: Dental caries present. No dental tenderness, gingival swelling, dental abscesses or gum lesions.     Tongue: No lesions.     Pharynx: Oropharynx is clear. Posterior oropharyngeal erythema present.     Tonsils: No tonsillar exudate or tonsillar abscesses.     Comments: Mild erythema posterior pharynx. No dental pain, gum swelling, tenderness. Cardiovascular:     Rate and Rhythm: Normal rate and regular rhythm.  Pulmonary:     Effort: Pulmonary effort is normal.  Skin:    General: Skin is warm and dry.  Neurological:     Mental Status: She is alert and oriented to person, place, and time.    UC  Treatments / Results  Labs (all labs ordered are listed, but only abnormal results are displayed) Labs Reviewed  CULTURE, GROUP A STREP [facility])  POCT RAPID STREP A, ED / UC    EKG  Radiology No results found.  Procedures Procedures (including critical care time)  Medications Ordered in UC Medications - No data to display  Initial Impression / Assessment and Plan / UC Course  I have reviewed the triage vital signs and the nursing notes.  Pertinent labs & imaging results that were available during my care of the patient were reviewed by me and considered in my medical decision making (see chart for details).  Strep negative, will culture Recommend ibuprofen/tylenol for pain. Reassuring the Tylenol helped her last night.  Also sent lidocaine to use as a swish/gargle and spit.  Provided with list of dentists for follow-up.  Return precautions discussed.  Final Clinical Impressions(s) / UC Diagnoses   Final diagnoses:  Mouth pain     Discharge Instructions      Strep test is negative.   Ibuprofen and tylenol for pain  You can swish the lidocaine around in the mouth the help with gum pain  Please follow up with dentist     ED Prescriptions     Medication Sig Dispense Auth. Provider   lidocaine (XYLOCAINE) 2 % solution Use as directed 15 mLs in the mouth or throat every 3 (three) hours as needed for mouth pain. 100 mL Burnis Kaser, PA-C   ibuprofen (ADVIL) 800 MG tablet Take 1 tablet (800 mg total) by mouth 3 (three) times daily. 21 tablet Geisha Abernathy, Wells Guiles, PA-C      PDMP not reviewed this encounter.   Kyra Leyland 11/17/22 [id number]

## 2022-11-20 LAB — CULTURE, GROUP A STREP (THRC)

## 2023-05-27 IMAGING — DX DG CHEST 1V PORT
1 series · 1 of 1 positions shown · non-contrast
Comparison: Chest x-ray dated October 14, 2015.

CLINICAL DATA: Cough, fever, and body aches.

EXAM:
PORTABLE CHEST 1 VIEW

[chest ap]
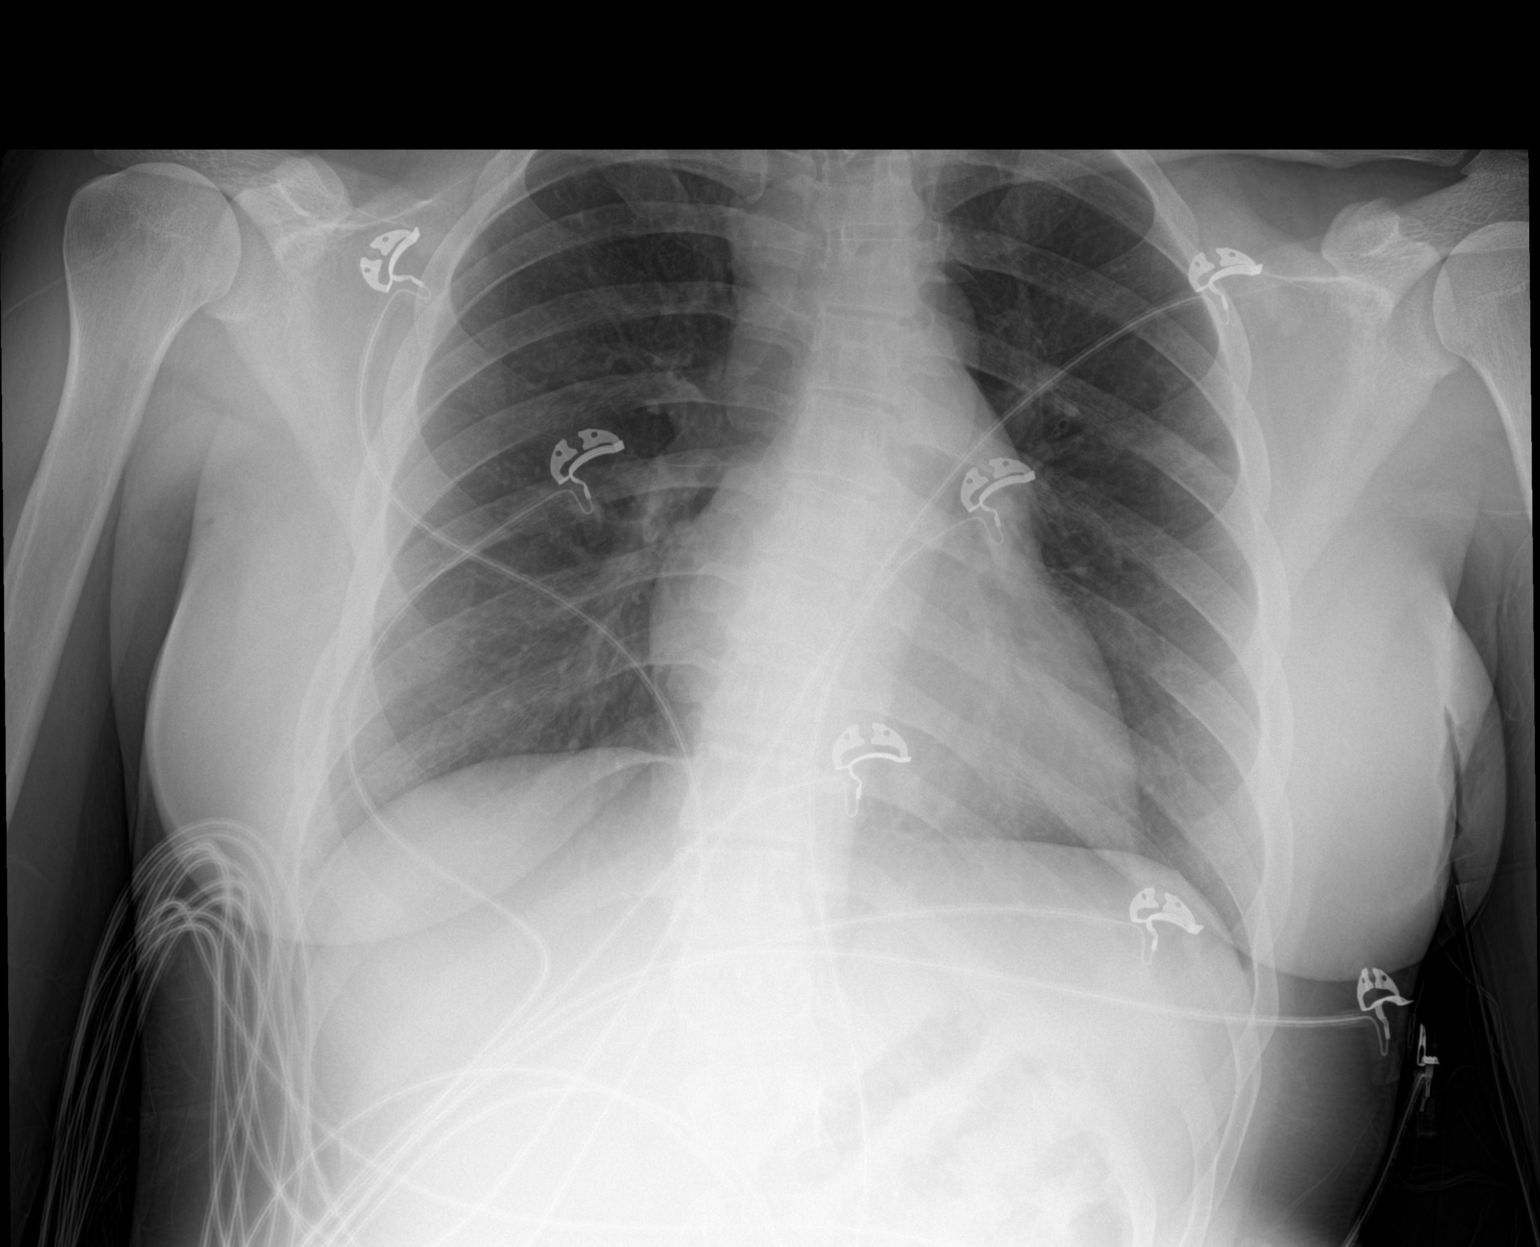

[1 of 1 positions shown; findings below may reference images not displayed]

FINDINGS: Normal heart size. New thickening of the right paratracheal stripe
and convexity of the AP window compared to the prior study. Normal
pulmonary vascularity. No focal consolidation, pleural effusion, or
pneumothorax. No acute osseous abnormality.
IMPRESSION: 1. New thickening of the right paratracheal stripe and convexity of
the AP window compared to the prior study, suspicious for underlying
lymphadenopathy. Recommend CT of the chest with contrast for further
evaluation.

## 2023-12-18 IMAGING — US US MFM UA CORD DOPPLER
1 series · 13 of 28 positions shown · non-contrast
Comparison: none

[Series 1: us mfm ua cord doppler · 13 of 40 slices shown]
[im 2/40]
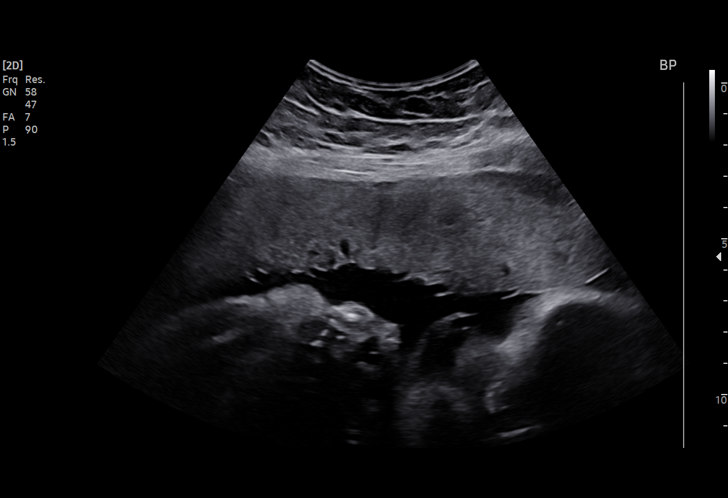
[im 5/40]
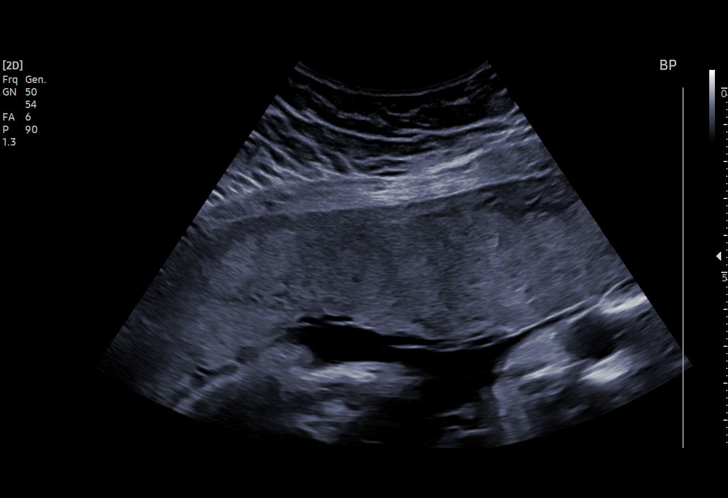
[im 8/40]
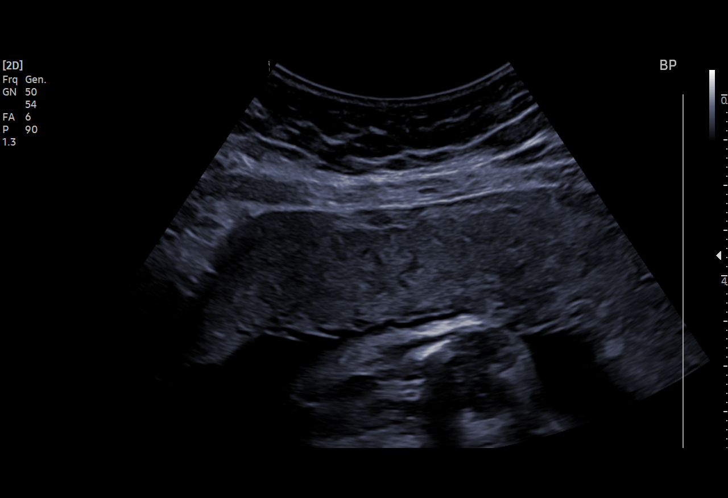
[im 11/40]
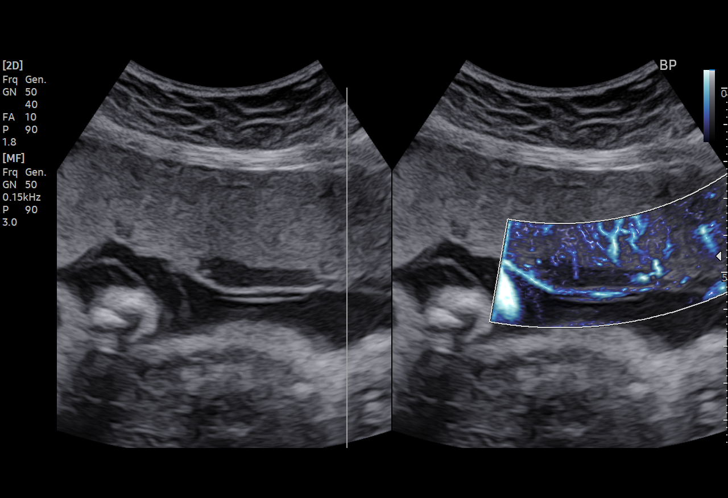
[im 14/40]
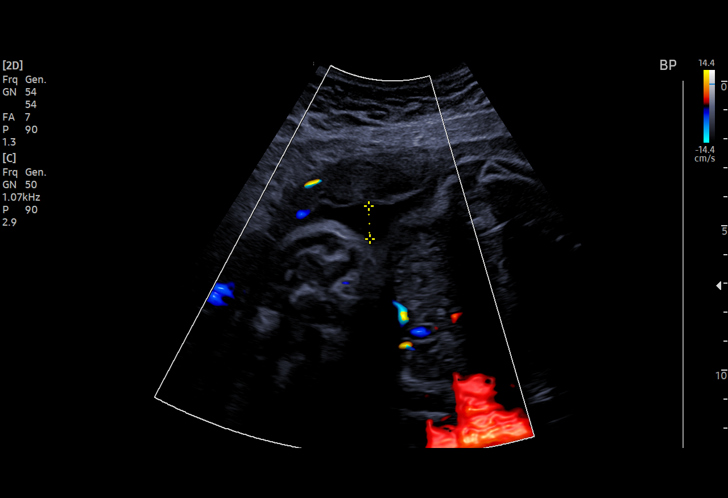
[im 16/40]
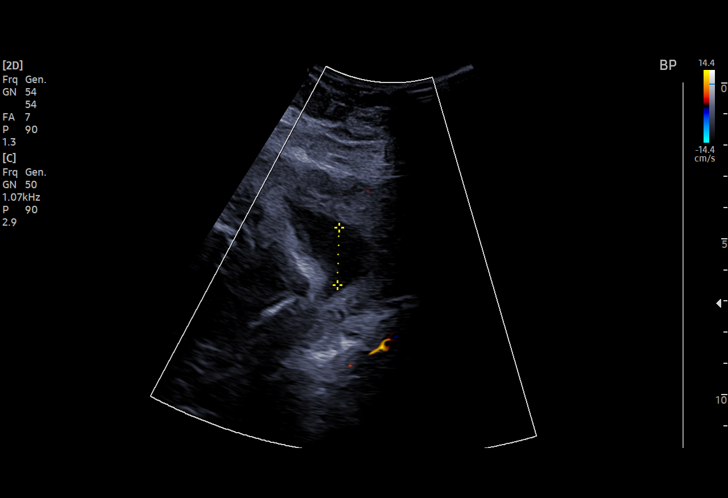
[im 21/40]
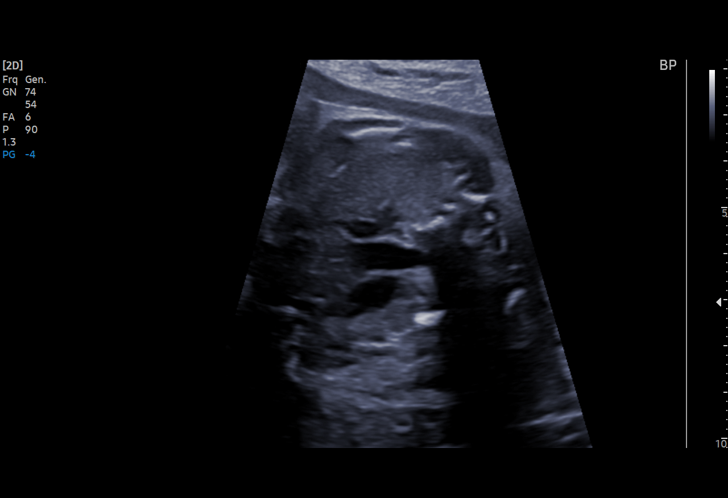
[im 24/40]
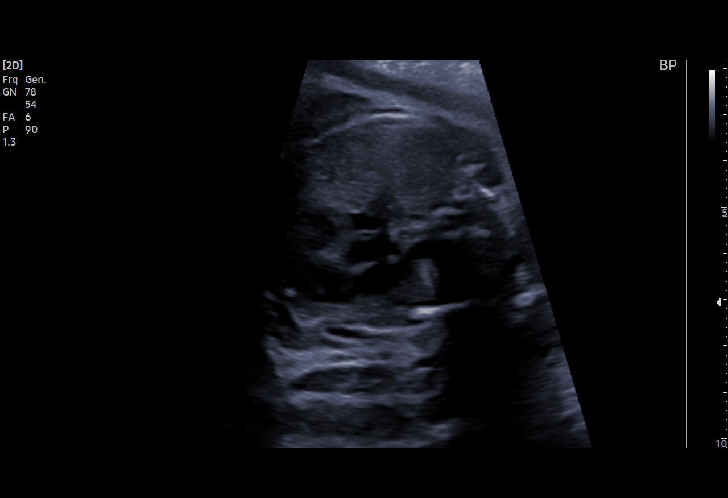
[im 27/40]
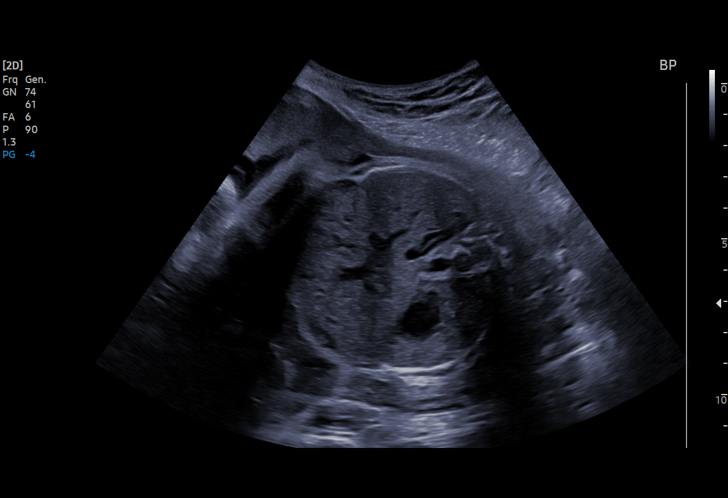
[im 29/40]
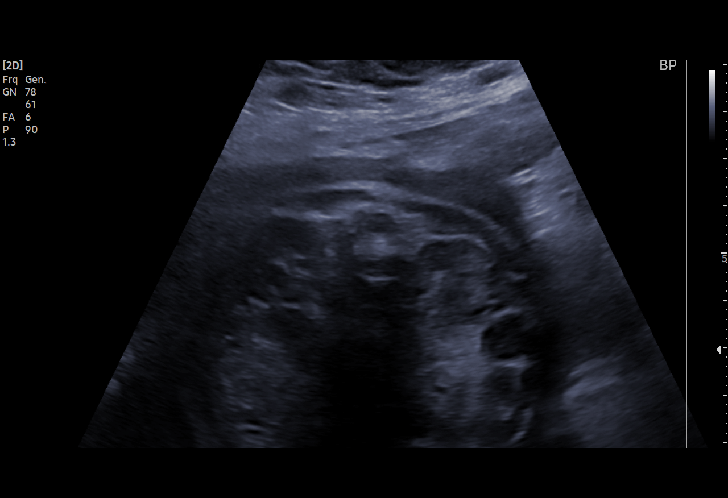
[im 32/40]
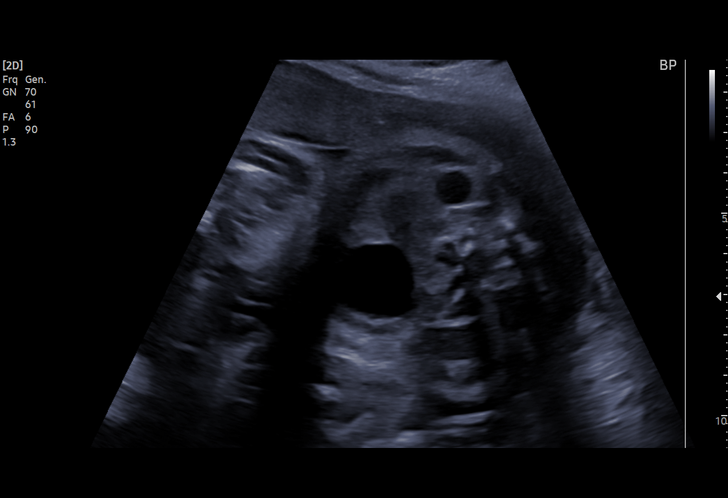
[im 35/40]
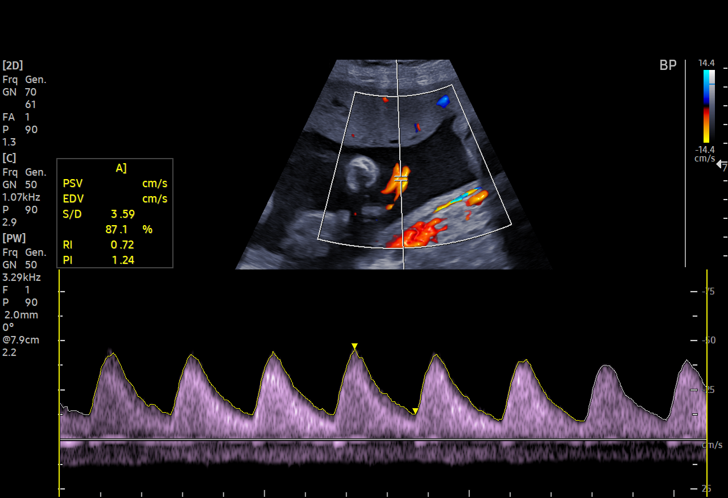
[im 38/40]
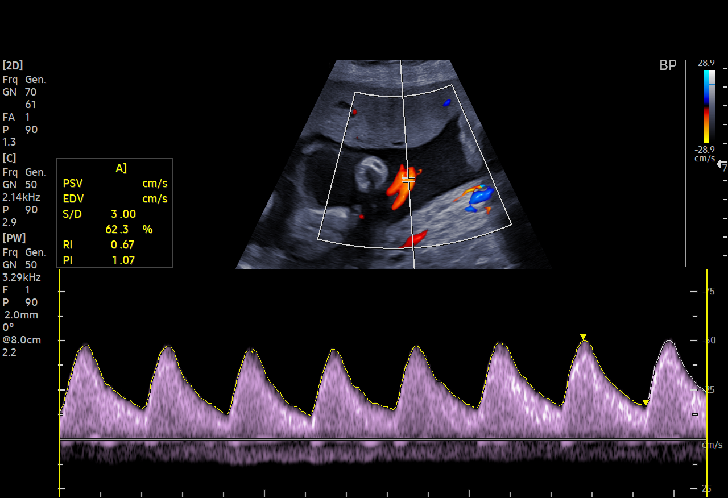

[13 of 28 positions shown; findings below may reference images not displayed]

[REDACTED]
                                                            Ave[address] [HOSPITAL]

Indications

 Maternal care for known or suspected poor
 fetal growth, third trimester, not applicable or
 unspecified IUGR
 Hyperthyroid (no meds)
 Family history of genetic disorder
 Medication exposure during first trimester of
 pregnancy
 30 weeks gestation of pregnancy
Vital Signs

 BP:          135/72
Fetal Evaluation

 Num Of Fetuses:         1
 Fetal Heart Rate(bpm):  134
 Cardiac Activity:       Observed
 Presentation:           Cephalic
 Placenta:               Anterior
 P. Cord Insertion:      Previously Visualized

 Amniotic Fluid
 AFI FV:      Within normal limits

 AFI Sum(cm)     %Tile       Largest Pocket(cm)
 11.22           24
 RUQ(cm)       RLQ(cm)       LUQ(cm)        LLQ(cm)

OB History

 Blood Type:   O+
 Gravidity:    1
Gestational Age

 LMP:           30w 5d        Date:  07/02/21                  EDD:   04/08/22
 Best:          30w 5d     Det. By:  LMP  (07/02/21)          EDD:   04/08/22
Anatomy

 Cranium:               Appears normal         LVOT:                   Appears normal
 Cavum:                 Appears normal         Aortic Arch:            Previously seen
 Ventricles:            Previously seen        Ductal Arch:            Previously seen
 Choroid Plexus:        Previously seen        Diaphragm:              Appears normal
 Cerebellum:            Previously seen        Stomach:                Appears normal, left
                                                                       sided
 Posterior Fossa:       Previously seen        Abdomen:                Previously seen
 Nuchal Fold:           Not applicable (>20    Abdominal Wall:         Previously seen
                        wks GA)
 Face:                  Orbits and profile     Cord Vessels:           Appears normal (3
                        previously seen                                vessel cord)
 Lips:                  Previously seen        Kidneys:                Appear normal
 Palate:                Not well visualized    Bladder:                Appears normal
 Thoracic:              Appears normal         Spine:                  Previously seen
 Heart:                 Appears normal         Upper Extremities:      Visualized
                        (4CH, axis, and                                previously
                        situs)
 RVOT:                  Appears normal         Lower Extremities:      Visualized
                                                                       previously

 Other:  3VTV visualized. Female gender previously seen. VC, 3VV, 3VTV,
         lenses, previously visualized. Technically difficult due to advanced
         gestational age.
Doppler - Fetal Vessels

 Umbilical Artery
  S/D     %tile      RI    %tile      PI    %tile     PSV    ADFV    RDFV
                                                    (cm/s)
  3.31       78     0.7       80    1.[REDACTED]      No      No

Cervix Uterus Adnexa

 Cervix
 Not visualized (advanced GA >88wks)

 Uterus
 Normal shape and size.

 Right Ovary
 Within normal limits.
 Left Ovary
 Within normal limits.
Impression

 Severe fetal growth restriction.  On ultrasound performed 2
 weeks ago, the estimated fetal weight was at the 1st
 percentile.

 On today's ultrasound, amniotic fluid is normal and good fetal
 activity seen.  Umbilical artery Doppler showed normal
 forward diastolic flow.  NST is reactive.
 Reassured the patient of the findings.
Recommendations

 Continue weekly antenatal testing till delivery.
                Ramiro, Yury

## 2023-12-31 IMAGING — US US MFM FETAL BPP W/ NON-STRESS
1 series · 14 of 28 positions shown · non-contrast
Comparison: none

[Series 1: us mfm fetal bpp w/ non-stress · 36 acquisitions, 14 frames shown]
[im 2/36]
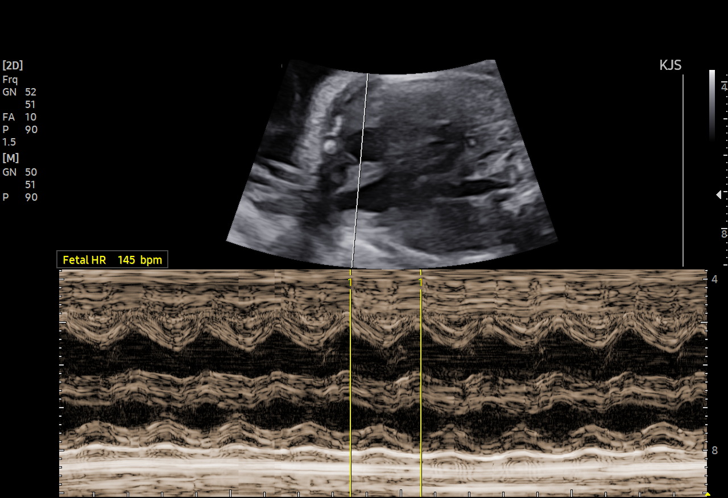
[im 4/36]
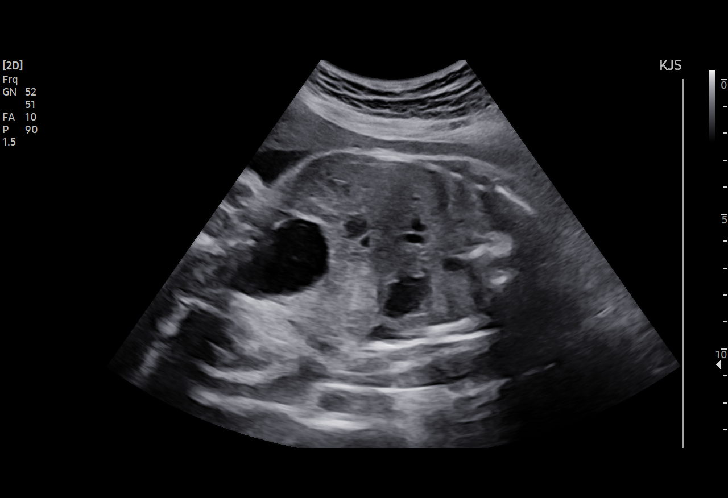
[im 7/36]
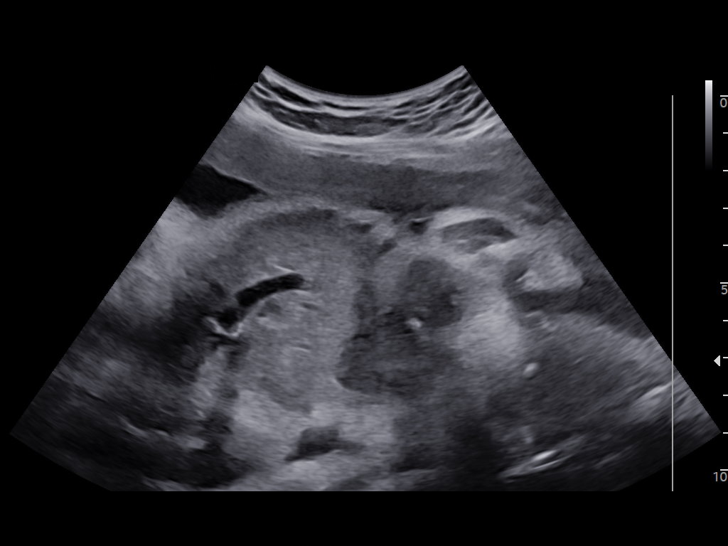
[im 10/36]
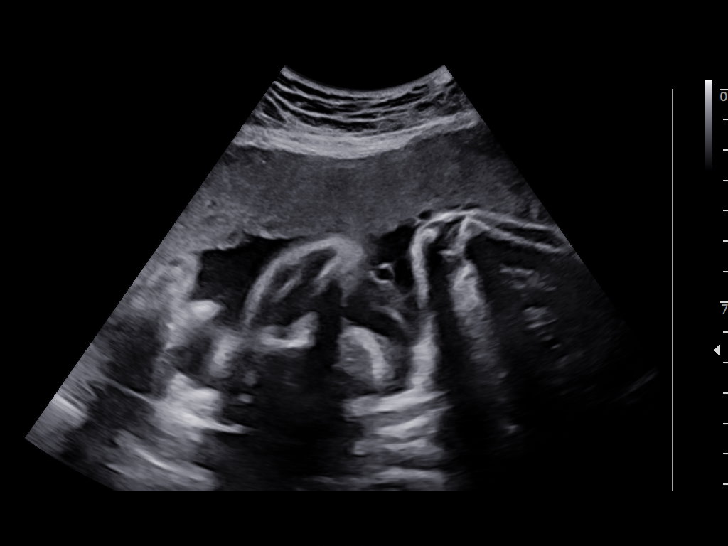
[im 12/36]
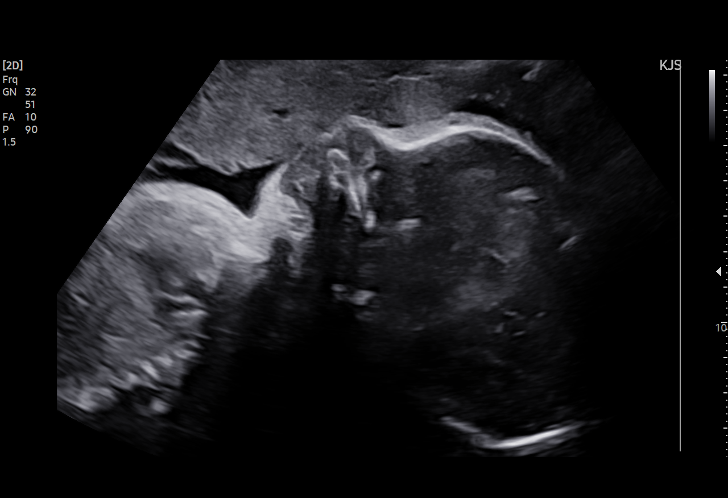
[im 15/36]
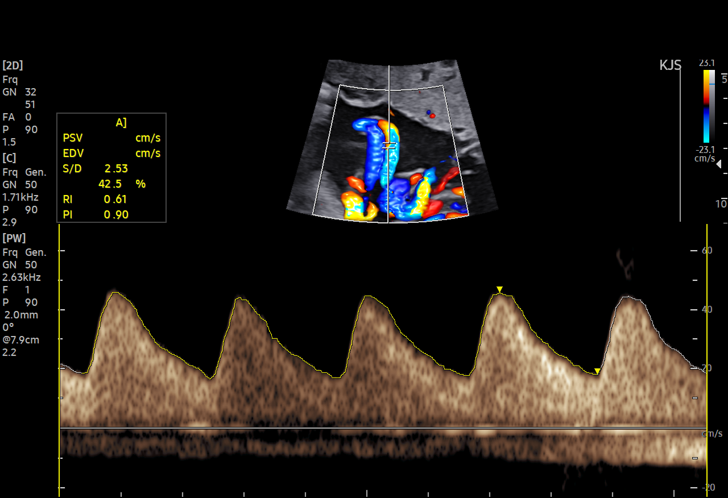
[im 17/36]
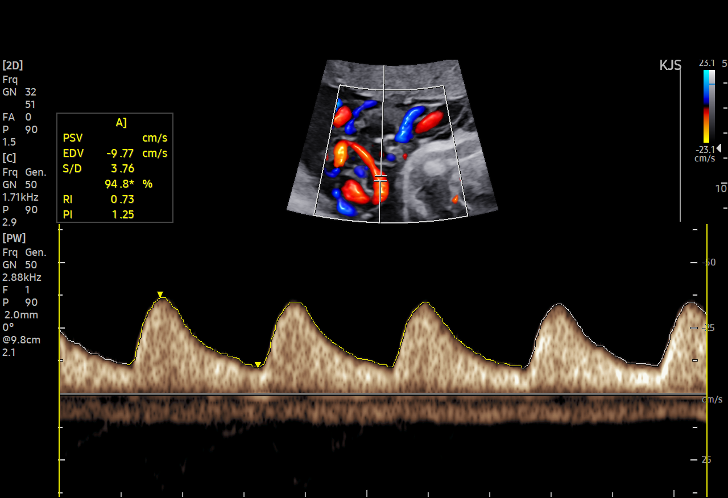
[im 20/36]
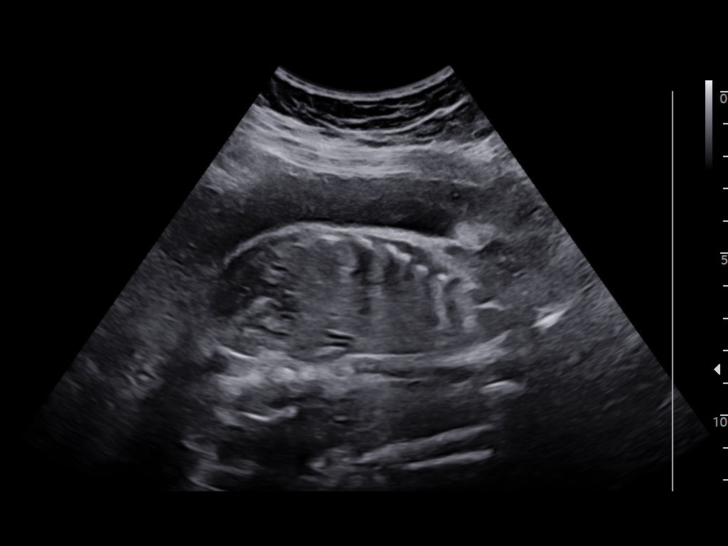
[im 23/36]
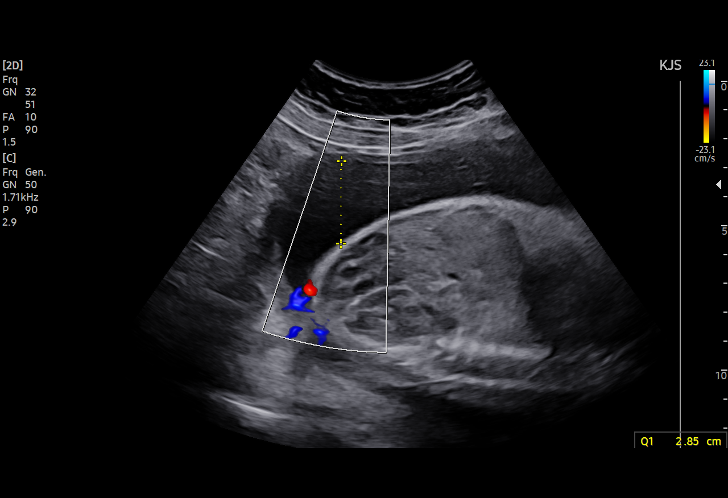
[im 25/36]
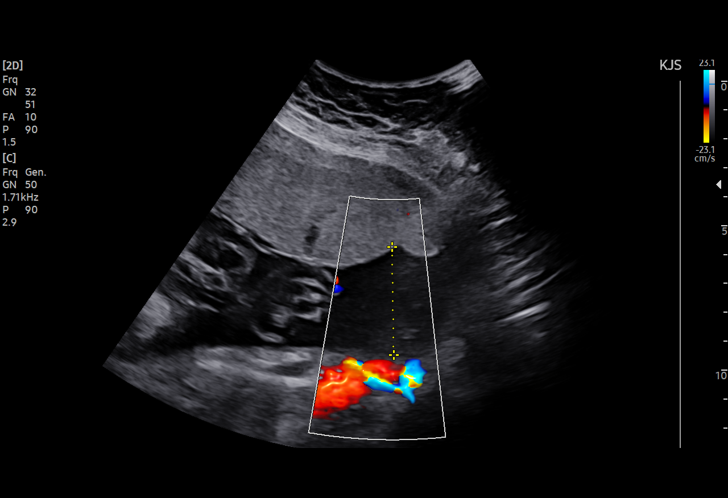
[im 28/36]
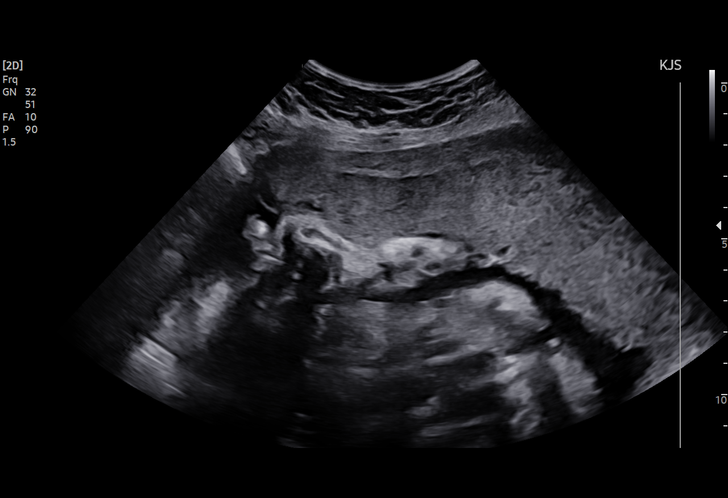
[im 30/36]
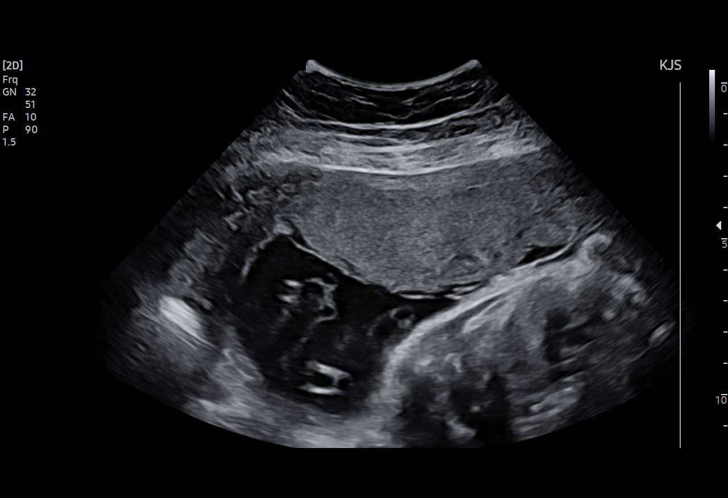
[im 33/36]
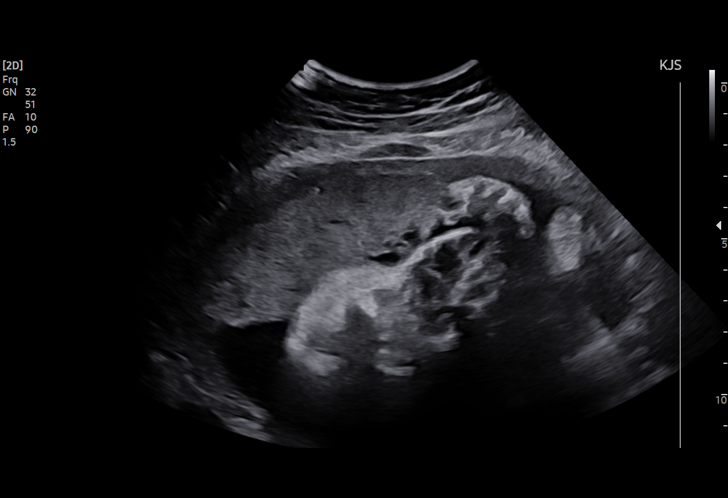
[im 36/36]
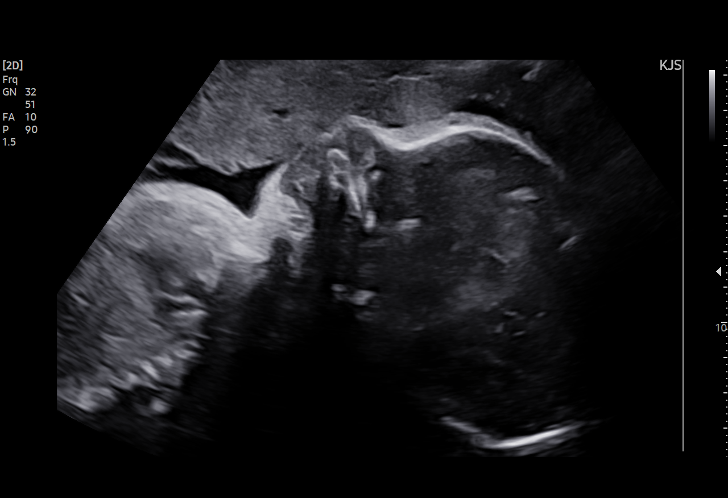

[14 of 28 positions shown; findings below may reference images not displayed]

[REDACTED]
                                                            Ave., [HOSPITAL]

    W/NONSTRESS

Indications

 Maternal care for known or suspected poor
 fetal growth, third trimester, not applicable or
 unspecified IUGR
 Hyperthyroid (no meds)
 Medication exposure during first trimester of
 pregnancy
 Family history of genetic disorder
 32 weeks gestation of pregnancy
Vital Signs

 BP:          129/81
Fetal Evaluation

 Num Of Fetuses:         1
 Fetal Heart Rate(bpm):  145
 Cardiac Activity:       Observed
 Presentation:           Cephalic
 Placenta:               Anterior
 P. Cord Insertion:      Previously Visualized
 Amniotic Fluid
 AFI FV:      Within normal limits

 AFI Sum(cm)     %Tile       Largest Pocket(cm)
 13.85           46

 RUQ(cm)       RLQ(cm)       LUQ(cm)        LLQ(cm)

Biophysical Evaluation

 Amniotic F.V:   Pocket => 2 cm             F. Tone:        Observed
 F. Movement:    Observed                   N.S.T:          Reactive
 F. Breathing:   Observed                   Score:          [DATE]
OB History

 Blood Type:   O+
 Gravidity:    1
Gestational Age

 LMP:           32w 4d        Date:  07/02/21                  EDD:   04/08/22
 Best:          32w 4d     Det. By:  LMP  (07/02/21)          EDD:   04/08/22
Doppler - Fetal Vessels

 Umbilical Artery
  S/D     %tile      RI    %tile      PI    %tile            ADFV    RDFV
  3.76       94    0.73       93    0.64      4.5               No      No

Impression

 Severe fetal growth restriction.  On ultrasound performed last
 week, the estimated fetal weight was at the 1st percentile and
 the abdominal circumference measurement was at the 2nd
 percentile.  Patient does not have hypertension.  She is
 hypothyroidism but does not take antithyroid medications.

 Amniotic fluid is normal and good fetal activity seen.
 Cephalic presentation.  Antenatal testing is reassuring.  NST
 is reactive.  BPP [DATE]. Fetal heart rate and rhythm appear
 normal.
Recommendations

 -Continue weekly BPP and NST till delivery.
 -Recommend thyroid function tests to rule out overt
 hyperthyroidism.
                Jaefar, Shano
